# Patient Record
Sex: Female | Born: 1940
Health system: Southern US, Community
[De-identification: ages and names within clinical notes are randomized; demographics above are authoritative.]

## PROBLEM LIST (undated history)

## (undated) DIAGNOSIS — E785 Hyperlipidemia, unspecified: Secondary | ICD-10-CM

## (undated) DIAGNOSIS — K5792 Diverticulitis of intestine, part unspecified, without perforation or abscess without bleeding: Secondary | ICD-10-CM

## (undated) DIAGNOSIS — Z8619 Personal history of other infectious and parasitic diseases: Secondary | ICD-10-CM

## (undated) DIAGNOSIS — I10 Essential (primary) hypertension: Secondary | ICD-10-CM

## (undated) DIAGNOSIS — H548 Legal blindness, as defined in USA: Secondary | ICD-10-CM

## (undated) DIAGNOSIS — H3552 Pigmentary retinal dystrophy: Secondary | ICD-10-CM

## (undated) DIAGNOSIS — G709 Myoneural disorder, unspecified: Secondary | ICD-10-CM

## (undated) DIAGNOSIS — Z800 Family history of malignant neoplasm of digestive organs: Secondary | ICD-10-CM

## (undated) HISTORY — DX: Diverticulitis of intestine, part unspecified, without perforation or abscess without bleeding: K57.92

## (undated) HISTORY — DX: Family history of malignant neoplasm of digestive organs: Z80.0

## (undated) HISTORY — DX: Hyperlipidemia, unspecified: E78.5

## (undated) HISTORY — PX: EYE SURGERY: SHX253

## (undated) HISTORY — DX: Personal history of other infectious and parasitic diseases: Z86.19

## (undated) HISTORY — DX: Myoneural disorder, unspecified: G70.9

## (undated) HISTORY — DX: Legal blindness, as defined in USA: H54.8

## (undated) HISTORY — DX: Essential (primary) hypertension: I10

## (undated) HISTORY — DX: Pigmentary retinal dystrophy: H35.52

## (undated) HISTORY — PX: HEMORRHOID SURGERY: SHX153

## (undated) HISTORY — PX: SPINE SURGERY: SHX786

## (undated) HISTORY — PX: COLONOSCOPY: SHX174

## (undated) HISTORY — PX: OTHER SURGICAL HISTORY: SHX169

---

## 1998-06-08 ENCOUNTER — Other Ambulatory Visit: Admission: RE | Admit: 1998-06-08 | Discharge: 1998-06-08 | Payer: Self-pay | Admitting: Family Medicine

## 1999-07-07 ENCOUNTER — Other Ambulatory Visit: Admission: RE | Admit: 1999-07-07 | Discharge: 1999-07-07 | Payer: Self-pay | Admitting: Obstetrics and Gynecology

## 2000-01-27 ENCOUNTER — Encounter: Payer: Self-pay | Admitting: Obstetrics and Gynecology

## 2000-01-27 ENCOUNTER — Encounter: Admission: RE | Admit: 2000-01-27 | Discharge: 2000-01-27 | Payer: Self-pay | Admitting: Obstetrics and Gynecology

## 2000-10-05 ENCOUNTER — Other Ambulatory Visit: Admission: RE | Admit: 2000-10-05 | Discharge: 2000-10-05 | Payer: Self-pay | Admitting: Obstetrics and Gynecology

## 2001-03-22 ENCOUNTER — Encounter: Payer: Self-pay | Admitting: Obstetrics and Gynecology

## 2001-03-22 ENCOUNTER — Encounter: Admission: RE | Admit: 2001-03-22 | Discharge: 2001-03-22 | Payer: Self-pay | Admitting: Obstetrics and Gynecology

## 2002-02-20 ENCOUNTER — Other Ambulatory Visit: Admission: RE | Admit: 2002-02-20 | Discharge: 2002-02-20 | Payer: Self-pay | Admitting: Obstetrics and Gynecology

## 2003-02-26 ENCOUNTER — Other Ambulatory Visit: Admission: RE | Admit: 2003-02-26 | Discharge: 2003-02-26 | Payer: Self-pay | Admitting: Obstetrics and Gynecology

## 2003-03-30 ENCOUNTER — Encounter: Admission: RE | Admit: 2003-03-30 | Discharge: 2003-03-30 | Payer: Self-pay | Admitting: Obstetrics and Gynecology

## 2004-04-22 ENCOUNTER — Other Ambulatory Visit: Admission: RE | Admit: 2004-04-22 | Discharge: 2004-04-22 | Payer: Self-pay | Admitting: Obstetrics and Gynecology

## 2004-05-10 ENCOUNTER — Encounter: Admission: RE | Admit: 2004-05-10 | Discharge: 2004-05-10 | Payer: Self-pay | Admitting: Obstetrics and Gynecology

## 2004-09-16 ENCOUNTER — Ambulatory Visit: Payer: Self-pay | Admitting: Gastroenterology

## 2004-09-29 ENCOUNTER — Ambulatory Visit (HOSPITAL_COMMUNITY): Admission: RE | Admit: 2004-09-29 | Discharge: 2004-09-29 | Payer: Self-pay | Admitting: Gastroenterology

## 2004-09-29 ENCOUNTER — Encounter (INDEPENDENT_AMBULATORY_CARE_PROVIDER_SITE_OTHER): Payer: Self-pay | Admitting: *Deleted

## 2004-09-29 ENCOUNTER — Ambulatory Visit: Payer: Self-pay | Admitting: Gastroenterology

## 2005-04-27 ENCOUNTER — Other Ambulatory Visit: Admission: RE | Admit: 2005-04-27 | Discharge: 2005-04-27 | Payer: Self-pay | Admitting: Obstetrics and Gynecology

## 2006-08-13 ENCOUNTER — Emergency Department (HOSPITAL_COMMUNITY): Admission: EM | Admit: 2006-08-13 | Discharge: 2006-08-14 | Payer: Self-pay | Admitting: Emergency Medicine

## 2006-09-03 ENCOUNTER — Ambulatory Visit: Payer: Self-pay

## 2006-09-03 ENCOUNTER — Encounter: Payer: Self-pay | Admitting: Internal Medicine

## 2008-06-27 ENCOUNTER — Encounter: Admission: RE | Admit: 2008-06-27 | Discharge: 2008-06-27 | Payer: Self-pay | Admitting: Internal Medicine

## 2008-07-07 ENCOUNTER — Telehealth: Payer: Self-pay | Admitting: Internal Medicine

## 2008-07-10 DIAGNOSIS — IMO0002 Reserved for concepts with insufficient information to code with codable children: Secondary | ICD-10-CM | POA: Insufficient documentation

## 2008-07-13 ENCOUNTER — Ambulatory Visit: Payer: Self-pay | Admitting: Internal Medicine

## 2008-07-13 DIAGNOSIS — K5732 Diverticulitis of large intestine without perforation or abscess without bleeding: Secondary | ICD-10-CM

## 2008-07-13 DIAGNOSIS — R1032 Left lower quadrant pain: Secondary | ICD-10-CM

## 2008-07-13 LAB — CONVERTED CEMR LAB
Ketones, ur: NEGATIVE mg/dL
Specific Gravity, Urine: <=1.005 (ref 1.000–1.030)
Urobilinogen, UA: 0.2 (ref 0.0–1.0)

## 2008-07-17 ENCOUNTER — Ambulatory Visit: Payer: Self-pay | Admitting: Cardiovascular Disease

## 2008-07-17 ENCOUNTER — Ambulatory Visit: Payer: Self-pay | Admitting: Gastroenterology

## 2008-07-17 ENCOUNTER — Telehealth: Payer: Self-pay | Admitting: Internal Medicine

## 2009-01-25 ENCOUNTER — Telehealth: Payer: Self-pay | Admitting: Internal Medicine

## 2009-10-12 ENCOUNTER — Encounter: Payer: Self-pay | Admitting: Internal Medicine

## 2010-02-09 ENCOUNTER — Encounter (INDEPENDENT_AMBULATORY_CARE_PROVIDER_SITE_OTHER): Payer: Self-pay | Admitting: *Deleted

## 2010-03-11 ENCOUNTER — Encounter (INDEPENDENT_AMBULATORY_CARE_PROVIDER_SITE_OTHER): Payer: Self-pay | Admitting: *Deleted

## 2010-03-17 ENCOUNTER — Telehealth: Payer: Self-pay | Admitting: Internal Medicine

## 2010-03-17 ENCOUNTER — Ambulatory Visit
Admission: RE | Admit: 2010-03-17 | Discharge: 2010-03-17 | Payer: Self-pay | Source: Home / Self Care | Attending: Internal Medicine | Admitting: Internal Medicine

## 2010-04-12 NOTE — Letter (Signed)
Summary: Pre Visit Letter Revised  [facility]  [address] [place], [place] [address]   Phone: [phone]  Fax: [phone]        02/09/2010 MRN: [id number] [facility] [place] [address] [place], [place]  [address]             Procedure Date:  03/31/2010   Welcome to the Gastroenterology Division at [facility].    You are scheduled to see a nurse for your pre-procedure visit on 03/17/2010 at 8:30 AM on the 3rd floor at [facility], [address]  We ask that you try to arrive at our office 15 minutes prior to your appointment time to allow for check-in.  Please take a minute to review the attached form.  If you answer "Yes" to one or more of the questions on the first page, we ask that you call the person listed at your earliest opportunity.  If you answer "No" to all of the questions, please complete the rest of the form and bring it to your appointment.    Your nurse visit will consist of discussing your medical and surgical history, your immediate family medical history, and your medications.   If you are unable to list all of your medications on the form, please bring the medication bottles to your appointment and we will list them.  We will need to be aware of both prescribed and over the counter drugs.  We will need to know exact dosage information as well.    Please be prepared to read and sign documents such as consent forms, a financial agreement, and acknowledgement forms.  If necessary, and with your consent, a friend or relative is welcome to sit-in on the nurse visit with you.  Please bring your insurance card so that we may make a copy of it.  If your insurance requires a referral to see a specialist, please bring your referral form from your primary care physician.  No co-pay is required for this nurse visit.     If you cannot keep your appointment, please call [phone] to cancel or reschedule prior to your appointment date.  This allows  [place] the opportunity to schedule an appointment for another patient in need of care.    Thank you for choosing [facility] for your medical needs.  We appreciate the opportunity to care for you.  Please visit [place] at our website  to learn more about our practice.  Sincerely, The Gastroenterology Division

## 2010-04-12 NOTE — Letter (Signed)
Summary: Colonoscopy Letter  [facility]  [address] [place], [place] [address]   Phone: [phone]  Fax: [phone]      October 12, 2009 MRN: [id number]   [facility] [place] [address] [place], [place]  [address]   Dear Ms. Fernicola,   According to your medical record, it is time for you to schedule a Colonoscopy. The American Cancer Society recommends this procedure as a method to detect early colon cancer. Patients with a family history of colon cancer, or a personal history of colon polyps or inflammatory bowel disease are at increased risk.  This letter has been generated based on the recommendations made at the time of your procedure. If you feel that in your particular situation this may no longer apply, please contact our office.  Please call our office at [phone] to schedule this appointment or to update your records at your earliest convenience.  Thank you for cooperating with [place] to provide you with the very best care possible.   Sincerely,  Wilhemina Bonito. Marina Goodell, M.D.  [facility] Division [phone]

## 2010-04-12 NOTE — Progress Notes (Signed)
Summary: inflamed colon  Phone Note Call from Patient Call back at Work Phone [phone]   Caller: Patient Call For: Dr. Marina Goodell Reason for Call: Talk to Nurse Details for Reason: inflamed colon Summary of Call: previous pt of Dr. Hilda Blades... requesting to be est with Dr. Marina Goodell... pt would like a "scan" of her colon- not a COL... pt says her colon is inflamed and that she has diverticulitis... pt would not sch a consultation with me... wants to discuss further what type pf appt she needs to sch Initial call taken by: Vallarie Mare,  July 07, 2008 12:39 PM  Follow-up for Phone Call        Patient given appointment with Dr Marina Goodell on 07/13/08 at 4pm. She was a patient of Dr Luellen Pucker and needs follow up.  Primary care doctor will fax over updated notes and labs test patient has had.  Will bring meds insurance and copay. Follow-up by: Paulene Floor, RN,  July 07, 2008 1:42 PM

## 2010-04-14 NOTE — Letter (Signed)
Summary: [facility] Instructions  [facility]  [address] [place], [place] [address]   Phone: [phone]  Fax: [phone]       Crystal Stokes    Jul 22, 1940    MRN: [id number]        Procedure Day /Date: Thursday 03/31/2010     Arrival Time: 7:30AM     Procedure Time: 8:30AM     Location of Procedure:                    _X_  [facility] (4th Floor)                       PREPARATION FOR COLONOSCOPY WITH MOVIPREP   Starting 5 days prior to your procedure 03/26/2010 do not eat nuts, seeds, popcorn, corn, beans, peas,  salads, or any raw vegetables.  Do not take any fiber supplements (e.g. Metamucil, Citrucel, and Benefiber).  THE DAY BEFORE YOUR PROCEDURE         DATE: 1/18     DAY: Wednesday  1.  Drink clear liquids the entire day-NO SOLID FOOD  2.  Do not drink anything colored red or purple.  Avoid juices with pulp.  No orange juice.  3.  Drink at least 64 oz. (8 glasses) of fluid/clear liquids during the day to prevent dehydration and help the prep work efficiently.  CLEAR LIQUIDS INCLUDE: Water Jello Ice Popsicles Tea (sugar ok, no milk/cream) Powdered fruit flavored drinks Coffee (sugar ok, no milk/cream) Gatorade Juice: apple, white grape, white cranberry  Lemonade Clear bullion, consomm, broth Carbonated beverages (any kind) Strained chicken noodle soup Hard Candy                             4.  In the morning, mix first dose of MoviPrep solution:    Empty 1 Pouch A and 1 Pouch B into the disposable container    Add lukewarm drinking water to the top line of the container. Mix to dissolve    Refrigerate (mixed solution should be used within 24 hrs)  5.  Begin drinking the prep at 5:00 p.m. The MoviPrep container is divided by 4 marks.   Every 15 minutes drink the solution down to the next mark (approximately 8 oz) until the full liter is complete.   6.  Follow completed prep with 16 oz of clear liquid of your choice  (Nothing red or purple).  Continue to drink clear liquids until bedtime.  7.  Before going to bed, mix second dose of MoviPrep solution:    Empty 1 Pouch A and 1 Pouch B into the disposable container    Add lukewarm drinking water to the top line of the container. Mix to dissolve    Refrigerate  THE DAY OF YOUR PROCEDURE      DATE:  1/19   DAY: Thursday  Beginning at 3:30AM (5 hours before procedure):         1. Every 15 minutes, drink the solution down to the next mark (approx 8 oz) until the full liter is complete.  2. Follow completed prep with 16 oz. of clear liquid of your choice.    3. You may drink clear liquids until 6:30AM (2 HOURS BEFORE PROCEDURE).   MEDICATION INSTRUCTIONS  Unless otherwise instructed, you should take regular prescription medications with a small sip of water   as early as possible the morning of  your procedure.         OTHER INSTRUCTIONS  You will need a responsible adult at least 69 years of age to accompany you and drive you home.   This person must remain in the waiting room during your procedure.  Wear loose fitting clothing that is easily removed.  Leave jewelry and other valuables at home.  However, you may wish to bring a book to read or  an iPod/MP3 player to listen to music as you wait for your procedure to start.  Remove all body piercing jewelry and leave at home.  Total time from sign-in until discharge is approximately 2-3 hours.  You should go home directly after your procedure and rest.  You can resume normal activities the  day after your procedure.  The day of your procedure you should not:   Drive   Make legal decisions   Operate machinery   Drink alcohol   Return to work  You will receive specific instructions about eating, activities and medications before you leave.    The above instructions have been reviewed and explained to me by   Karl Bales RN  March 17, 2010 8:50 AM    I fully  understand and can verbalize these instructions _____________________________ Date _________

## 2010-04-14 NOTE — Miscellaneous (Signed)
Summary: [facility] previsit  Clinical Lists Changes  Medications: Added new medication of MOVIPREP 100 GM  SOLR (PEG-KCL-NACL-NASULF-NA ASC-C) As per prep instructions. - Signed Rx of MOVIPREP 100 GM  SOLR (PEG-KCL-NACL-NASULF-NA ASC-C) As per prep instructions.;  #1 x 0;  Signed;  Entered by: Karl Bales RN;  Authorized by: Hilarie Fredrickson MD;  Method used: Electronically to [facility] # [phone]*, [address] [place], [place], [place]  [address], Ph: [phone], Fax: [phone] Observations: Added new observation of NKA: T (03/17/2010 8:14)    Prescriptions: MOVIPREP 100 GM  SOLR (PEG-KCL-NACL-NASULF-NA ASC-C) As per prep instructions.  #1 x 0   Entered by:   Karl Bales RN   Authorized by:   Hilarie Fredrickson MD   Signed by:   Karl Bales RN on 03/17/2010   Method used:   Electronically to        [facility] # [phone]* (retail)       [address] [place], [place]  [address]       Ph: [phone]       Fax: [phone]   RxID:   [id number]

## 2010-04-14 NOTE — Progress Notes (Signed)
Summary: Colonoscopy / sedation  Phone Note Outgoing Call   Call placed by: Karl Bales RN,  March 17, 2010 9:40 AM Summary of Call: Dr. Marina Goodell, This pt. was seen this morning for her previsit. Her colonoscopy is scheduled for 03-31-10.  While reviewing her last procedure, I noted she had an incomplete exam due to a redundant and severly torturous colon in 2006 with Dr. Doreatha Martin.  She received Fentanyl 125 micrograms and Versed 12 mg.  I discussed with her if  her pain was adequately controlled with her last procedure and she stated no, but she had never had problems before.  I discussed with her the option of using Propofol and she refused.  I just wanted you to be aware of her situation.  Thank you.  Karl Bales Initial call taken by: Karl Bales RN,  March 17, 2010 9:43 AM  Follow-up for Phone Call        ok, but propofol would be great for her. Tell her that we talked, and I highly recommend this for her Follow-up by: Hilarie Fredrickson MD,  March 17, 2010 10:18 AM  Additional Follow-up for Phone Call Additional follow up Details #1::        Called pt and left voicemail for her to call back Additional Follow-up by: Karl Bales RN,  March 17, 2010 11:39 AM    Additional Follow-up for Phone Call Additional follow up Details #2::    Spoke with pt. again and above from Dr. Marina Goodell explained.  Emphasized the prep is the same, her VS are monitored in the same way as if Fentanyl and Versed were given and recovery room time is the same.  She states that she wanted to talk to her husband before making a decision and will call back in the a.m. Follow-up by: Karl Bales RN,  March 17, 2010 2:06 PM  Additional Follow-up for Phone Call Additional follow up Details #3:: Details for Additional Follow-up Action Taken: ok. thanks. Hilarie Fredrickson MD  March 17, 2010 2:14 PM   Spoke with pt and she is agreeable to having procedure changed to January 31st.  Procedure for 03-31-10  cancelled and rescheduled for 04-12-10 at 9:30 a.m.  Pt notified Additional Follow-up by: Karl Bales RN,  March 17, 2010 2:26 PM

## 2010-04-28 ENCOUNTER — Other Ambulatory Visit: Payer: Self-pay | Admitting: Internal Medicine

## 2010-06-03 ENCOUNTER — Encounter (HOSPITAL_BASED_OUTPATIENT_CLINIC_OR_DEPARTMENT_OTHER)
Admission: RE | Admit: 2010-06-03 | Discharge: 2010-06-03 | Disposition: A | Discharge status: Home / Self Care | Payer: BC Managed Care – PPO | Source: Ambulatory Visit | Attending: Surgery | Admitting: Surgery

## 2010-06-03 LAB — BASIC METABOLIC PANEL
CO2: 28 mEq/L (ref 19–32)
Chloride: 106 mEq/L (ref 96–112)
GFR calc Af Amer: >60 mL/min (ref >60)
GFR calc non Af Amer: >60 mL/min (ref >60)
Glucose, Bld: 132 mg/dL — ABNORMAL HIGH (ref 70–99)
Potassium: 3.8 mEq/L (ref 3.5–5.1)

## 2010-06-08 ENCOUNTER — Other Ambulatory Visit: Payer: Self-pay | Admitting: Surgery

## 2010-06-08 ENCOUNTER — Ambulatory Visit (HOSPITAL_BASED_OUTPATIENT_CLINIC_OR_DEPARTMENT_OTHER)
Admission: RE | Admit: 2010-06-08 | Discharge: 2010-06-08 | Disposition: A | Discharge status: Home / Self Care | Payer: BC Managed Care – PPO | Source: Ambulatory Visit | Attending: Surgery | Admitting: Surgery

## 2010-06-08 DIAGNOSIS — Z01812 Encounter for preprocedural laboratory examination: Secondary | ICD-10-CM | POA: Insufficient documentation

## 2010-06-08 DIAGNOSIS — K644 Residual hemorrhoidal skin tags: Secondary | ICD-10-CM | POA: Insufficient documentation

## 2010-06-08 DIAGNOSIS — L290 Pruritus ani: Secondary | ICD-10-CM | POA: Insufficient documentation

## 2010-06-08 LAB — POCT HEMOGLOBIN-HEMACUE: Hemoglobin: 12.3 g/dL (ref 12.0–15.0)

## 2010-06-27 NOTE — Op Note (Signed)
  NAME:  Crystal Stokes, WOEHL A             ACCOUNT NO.:  [id number]  MEDICAL RECORD NO.:  [id number]          PATIENT TYPE:  AMB  LOCATION:                               FACILITY:  [facility]  PHYSICIAN:  Thornton Park. Daphine Deutscher, MD  DATE OF BIRTH:  December 17, 1940  DATE OF PROCEDURE:  06/08/2010 DATE OF DISCHARGE:                              OPERATIVE REPORT   PREOPERATIVE DIAGNOSIS:  Postdefecation pruritus with no external hemorrhoids at 12 o'clock in the dorsal lithotomy position.  PROCEDURE:  Exam under anesthesia, biopsy of ulcerated area at the 6 o'clock position up near the dentate line, no banding, anterior external hemorrhoidectomy.  ANESTHESIA:  Local 10 mL with 0.5% Marcaine with LMA.  SURGEON:  Thornton Park. Daphine Deutscher, MD  DESCRIPTION OF PROCEDURE:  [name] was taken to room 8 to [facility] on Wednesday, June 08, 2010.  General was provided with an LMA. She was placed in dorsal lithotomy position.  Her perineum was prepped with PC max and draped sterilely.  Exam under anesthesia was performed first digitally, I could feel a little something posteriorly where there might have been thrombosed hemorrhoid.  I surveyed the abdomen with using the bullet retractor and did not really see much in the way of internal hemorrhoids that I would necessary band, but at the 12 o'clock position initially I thought it might be a little solitary rectal ulcer, but then again I did not think it was right at the dentate line.  It was very subtle and so I went ahead and took a little mucosal biopsy of this area.  This was done with a free blade and it was sent separately for at the 6 o'clock position up in the inside.  Cautery was used to initially control.  I had some lateral spread along the wall at 5 o'clock in the internal hemorrhoidal column from the heat.  I then controlled this with a single suture of 3-0 chromic inside.  Next, the anterior external hemorrhoid hood was excised cutting around that  leaving this sort of a crescent shaped skin defect.  This was closed from right to left with a running simple suture of 3-0 chromic after cauterizing it first.  I then injected the entire region with 10 mL of 0.5% Marcaine and massaged with some Betadine ointment into the entire area and sterile dressings were applied.  The patient will be given some Roxicet elixir to take if she needs it for pain.  I gave her and her husband instructions preoperatively on management.  We will see her back in the office in about 3 weeks.     Thornton Park Daphine Deutscher, MD     MBM/MEDQ  D:  06/08/2010  T:  06/09/2010  Job:  [id number]  Electronically Signed by Luretha Murphy MD on 06/27/2010 09:27:02 AM

## 2010-07-01 ENCOUNTER — Other Ambulatory Visit: Payer: Self-pay | Admitting: Internal Medicine

## 2010-07-13 ENCOUNTER — Other Ambulatory Visit: Payer: Self-pay | Admitting: Family Medicine

## 2010-07-13 ENCOUNTER — Other Ambulatory Visit: Payer: Self-pay | Admitting: Internal Medicine

## 2010-07-13 DIAGNOSIS — R1011 Right upper quadrant pain: Secondary | ICD-10-CM

## 2010-07-20 ENCOUNTER — Ambulatory Visit
Admission: RE | Admit: 2010-07-20 | Discharge: 2010-07-20 | Disposition: A | Discharge status: Home / Self Care | Payer: BC Managed Care – PPO | Source: Ambulatory Visit | Attending: Family Medicine | Admitting: Family Medicine

## 2010-07-20 DIAGNOSIS — R1011 Right upper quadrant pain: Secondary | ICD-10-CM

## 2010-07-20 MED ORDER — IOHEXOL 300 MG/ML  SOLN
100.0000 mL | Freq: Once | INTRAMUSCULAR | Status: AC | PRN
  Administered 2010-07-20: 100 mL via INTRAVENOUS

## 2010-07-22 ENCOUNTER — Other Ambulatory Visit: Payer: Self-pay | Admitting: Internal Medicine

## 2010-08-18 ENCOUNTER — Other Ambulatory Visit: Payer: Self-pay | Admitting: Internal Medicine

## 2010-09-06 ENCOUNTER — Encounter: Payer: Self-pay | Admitting: Internal Medicine

## 2010-09-06 ENCOUNTER — Other Ambulatory Visit: Payer: BC Managed Care – PPO

## 2010-09-06 ENCOUNTER — Telehealth: Payer: Self-pay | Admitting: Internal Medicine

## 2010-09-06 ENCOUNTER — Ambulatory Visit (INDEPENDENT_AMBULATORY_CARE_PROVIDER_SITE_OTHER): Payer: BC Managed Care – PPO | Admitting: Internal Medicine

## 2010-09-06 VITALS — BP 128/62 | HR 74 | Ht 66.0 in | Wt 148.0 lb

## 2010-09-06 DIAGNOSIS — Z9283 Personal history of failed moderate sedation: Secondary | ICD-10-CM

## 2010-09-06 DIAGNOSIS — R143 Flatulence: Secondary | ICD-10-CM

## 2010-09-06 DIAGNOSIS — R142 Eructation: Secondary | ICD-10-CM

## 2010-09-06 DIAGNOSIS — R141 Gas pain: Secondary | ICD-10-CM

## 2010-09-06 DIAGNOSIS — Z800 Family history of malignant neoplasm of digestive organs: Secondary | ICD-10-CM

## 2010-09-06 MED ORDER — PEG-KCL-NACL-NASULF-NA ASC-C 100 G PO SOLR: 1.0000 | Freq: Once | ORAL | Status: DC

## 2010-09-06 MED ORDER — METRONIDAZOLE 250 MG PO TABS: 250.0000 mg | ORAL_TABLET | Freq: Every day | ORAL | Status: AC

## 2010-09-06 NOTE — Patient Instructions (Addendum)
Colon with Propoful [facility] 09/27/10 11:00 am arrive at 10:00 am on 4th floor Moviprep sent to your pharmacy. Colon brochure given for you to review. Please go to basement floor for your labs. Take the Align x 2 weeks after completing your Flagyl.

## 2010-09-06 NOTE — Telephone Encounter (Signed)
Spoke with pt, she had questions regarding her medication she was given today. Reviewed instructions with pt and questions answered.

## 2010-09-06 NOTE — Progress Notes (Signed)
HISTORY OF PRESENT ILLNESS:  Crystal Stokes is a 70 y.o. female with the below listed medical history. She is followed in the esophagus principally for screening colonoscopy due to to a family history of colon cancer. She was last seen on 07/13/2008 regarding left lower quadrant discomfort of uncertain cause. Negative CT scan and negative urinalysis. She was to have set up repeat screening colonoscopy last year, but has yet to do so. Earlier this year she underwent surgical evaluation for hemorrhoidectomy and biopsy of ulcerated region. The biopsy revealed changes consistent with mucosal prolapse. Her chief complaint today is that of chronic bloating. This has been present for at least a year but worse in the past 3-6 months. Symptoms are less prominent in the morning and more prominent in the afternoon and evening. Symptoms are exacerbated by meals. Symptoms improve post defecation. Bowel habits are described as normal. No significant weight change. No belching but increased flatus. She has tried to alter her diet, avoid gluten, administer Zantac, and Beano. Unfortunately, these manipulations did not prove hopeful. She exercises regularly. No new medications. Because of the uncomfortable nature of the bloating, her primary provider scheduled a CT scan of the abdomen and pelvis with contrast. This was performed on 07/20/2010. The examination was unremarkable. Her last colonoscopy was performed in July 2006. Examination was incomplete but normal. Followup barium enema to clear the right colon was unremarkable. GI review of systems is otherwise negative  REVIEW OF SYSTEMS:  All non-GI ROS negative.  Past Medical History  Diagnosis Date  . Family history of colon cancer   . Hypertension   . Diverticulitis   . Legal blindness   . Hemorrhoids   . History of shingles     Past Surgical History  Procedure Date  . Hemorrhoid surgery     Social History Crystal Stokes  reports that she has never  smoked. She has never used smokeless tobacco. She reports that she drinks alcohol. She reports that she does not use illicit drugs.  family history includes Colon cancer in her mother.  No Known Allergies     PHYSICAL EXAMINATION: Vital signs: BP 128/62  Pulse 74  Ht 5\' 6"  (1.676 m)  Wt 148 lb (67.132 kg)  BMI 23.89 kg/m2 General: Well-developed, well-nourished, no acute distress HEENT: Sclerae are anicteric, conjunctiva pink. Oral mucosa intact Lungs: Clear Heart: Regular Abdomen: soft, nontender, nondistended, no obvious ascites, no peritoneal signs, normal bowel sounds. No organomegaly. Extremities: No edema Psychiatric: alert and oriented x3. Cooperative    ASSESSMENT:  #1. Chronic bloating. No alarm features. Negative CT. #2. Family history of colon cancer. Last colonoscopy in 2006 in complete. Negative subsequent barium enema. Due for followup   PLAN:  #1. Screen for celiac disease with serologies #2. Discussion on increased intestinal gas #3. Brochure on gas provided #4. Anti-gas and flatulence dietary she provided #5. Empiric trial of metronidazole 250 mg 4 times a day x7 days. Advised about potential side effects and to avoid alcohol #6. Probiotic Align one daily for 2 weeks to be initiated after antibiotic therapy. 2 weeks of samples provided #7. Schedule surveillance colonoscopy. Given prior difficulties, recommend CRNA monitored propofol sedation. Movi prep prescribed. Patient instructed on its use.The nature of the procedure, as well as the risks, benefits, and alternatives were carefully and thoroughly reviewed with the patient. Ample time for discussion and questions allowed. The patient understood, was satisfied, and agreed to proceed.

## 2010-09-08 ENCOUNTER — Telehealth: Payer: Self-pay

## 2010-09-08 NOTE — Telephone Encounter (Signed)
Message copied by Michele Mcalpine on Thu Sep 08, 2010 10:18 AM ------      Message from: Hilarie Fredrickson      Created: Thu Sep 08, 2010  9:03 AM       Please let the patient noted that her celiac testing returned normal

## 2010-09-08 NOTE — Telephone Encounter (Signed)
Left pt a message regarding results per Dr. Marina Goodell.

## 2010-09-27 ENCOUNTER — Encounter: Payer: Self-pay | Admitting: Internal Medicine

## 2010-09-27 ENCOUNTER — Ambulatory Visit (AMBULATORY_SURGERY_CENTER): Payer: BC Managed Care – PPO | Admitting: Internal Medicine

## 2010-09-27 VITALS — BP 138/64 | HR 74 | Temp 98.4°F | Resp 18 | Ht 65.0 in | Wt 140.0 lb

## 2010-09-27 DIAGNOSIS — R142 Eructation: Secondary | ICD-10-CM

## 2010-09-27 DIAGNOSIS — D1330 Benign neoplasm of unspecified part of small intestine: Secondary | ICD-10-CM

## 2010-09-27 DIAGNOSIS — D126 Benign neoplasm of colon, unspecified: Secondary | ICD-10-CM

## 2010-09-27 DIAGNOSIS — Z1211 Encounter for screening for malignant neoplasm of colon: Secondary | ICD-10-CM

## 2010-09-27 DIAGNOSIS — Z800 Family history of malignant neoplasm of digestive organs: Secondary | ICD-10-CM

## 2010-09-27 DIAGNOSIS — K5730 Diverticulosis of large intestine without perforation or abscess without bleeding: Secondary | ICD-10-CM

## 2010-09-27 MED ORDER — SODIUM CHLORIDE 0.9 % IV SOLN: 500.0000 mL | INTRAVENOUS | Status: DC

## 2010-09-27 NOTE — Patient Instructions (Signed)
Discharge instructions given with verbal understanding. Handouts on polyps, diverticulosis, and hemorrhoids given. Resume previous medications.

## 2010-09-28 ENCOUNTER — Telehealth: Payer: Self-pay | Admitting: *Deleted

## 2010-09-28 NOTE — Telephone Encounter (Signed)
No answer

## 2010-12-29 LAB — BASIC METABOLIC PANEL
Calcium: 9.4
GFR calc Af Amer: >60
GFR calc non Af Amer: >60
Glucose, Bld: 129 — ABNORMAL HIGH
Sodium: 139

## 2010-12-29 LAB — CBC
Hemoglobin: 12.4
RDW: 12.6
WBC: 6.6

## 2010-12-29 LAB — DIFFERENTIAL
Basophils Absolute: 0
Lymphocytes Relative: 37
Lymphs Abs: 2.4
Monocytes Absolute: 0.6
Neutro Abs: 3.4

## 2010-12-29 LAB — SEDIMENTATION RATE: Sed Rate: 12

## 2011-02-10 ENCOUNTER — Other Ambulatory Visit: Payer: Self-pay | Admitting: Obstetrics and Gynecology

## 2011-04-27 ENCOUNTER — Ambulatory Visit: Payer: BC Managed Care – PPO

## 2011-04-27 ENCOUNTER — Ambulatory Visit (INDEPENDENT_AMBULATORY_CARE_PROVIDER_SITE_OTHER): Payer: BC Managed Care – PPO | Admitting: Internal Medicine

## 2011-04-27 VITALS — BP 110/62 | HR 76 | Temp 98.0°F | Resp 18 | Ht 64.0 in | Wt 148.8 lb

## 2011-04-27 DIAGNOSIS — M25512 Pain in left shoulder: Secondary | ICD-10-CM

## 2011-04-27 DIAGNOSIS — M542 Cervicalgia: Secondary | ICD-10-CM

## 2011-04-27 DIAGNOSIS — M5414 Radiculopathy, thoracic region: Secondary | ICD-10-CM

## 2011-04-27 DIAGNOSIS — M25519 Pain in unspecified shoulder: Secondary | ICD-10-CM

## 2011-04-27 DIAGNOSIS — IMO0002 Reserved for concepts with insufficient information to code with codable children: Secondary | ICD-10-CM

## 2011-04-27 LAB — POCT CBC
Granulocyte percent: 54.8 %G (ref 37–80)
HCT, POC: 39.2 % (ref 37.7–47.9)
MPV: 8.2 fL (ref 0–99.8)
POC Granulocyte: 3.2 (ref 2–6.9)
POC LYMPH PERCENT: 37.3 %L (ref 10–50)
RDW, POC: 14.1 %

## 2011-04-27 LAB — POCT SEDIMENTATION RATE: POCT SED RATE: 27 mm/hr — AB (ref 0–22)

## 2011-04-27 MED ORDER — LIDOCAINE 5 % EX PTCH: 1.0000 | MEDICATED_PATCH | CUTANEOUS | Status: DC

## 2011-04-27 MED ORDER — LIDOCAINE 5 % EX PTCH: 1.0000 | MEDICATED_PATCH | CUTANEOUS | Status: AC

## 2011-04-27 MED ORDER — GABAPENTIN 300 MG PO CAPS: 300.0000 mg | ORAL_CAPSULE | Freq: Three times a day (TID) | ORAL | Status: DC

## 2011-04-27 NOTE — Progress Notes (Addendum)
     Patient ID: Crystal Stokes, female   DOB: Oct 16, 1940, 71 y.o.   MRN: [id number]  HPI C/o chronic pain over 2 yrs at left lower neck and upper left t-spine.Pain is constant, waxes and wanes,can wake her at hs. No radiation,weakness,tingling,or function loss.  This year she has had a cpe with gyn Dr. Tenny Craw, colonoscopy with Dr. Marina Goodell, normal mamogram.  Review of Systems Neg.    Objective:   Physical Exam  Nursing note and vitals reviewed. Constitutional: She is oriented to person, place, and time. She appears well-developed and well-nourished. No distress.  HENT:  Head: Normocephalic.  Eyes: Conjunctivae, EOM and lids are normal.       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  Neck: Muscular tenderness present. Rigidity present.    Musculoskeletal:       Right shoulder: She exhibits decreased range of motion. She exhibits no tenderness, no pain and normal strength.       Arms:      Lower neck and upper thorac. paraspinous tenderness  Neurological: She is alert and oriented to person, place, and time. She has normal strength and normal reflexes. No sensory deficit. Coordination normal.  Reflex Scores:      Tricep reflexes are 2+ on the right side and 2+ on the left side.      Bicep reflexes are 2+ on the right side and 2+ on the left side.  [facility] reading (PRIMARY) by  Dr. Perrin Maltese severe ddd and spondylosis c-spine.      Assessment:     C- T spine pain and paraspinous pain.   Plan:      XR t and c spine    Trial of Lidoderm patches for comfort Neurontin 300mg  prn Vicodin 5/325 prn OK to refill one year

## 2011-04-28 ENCOUNTER — Encounter: Payer: Self-pay | Admitting: *Deleted

## 2011-04-28 ENCOUNTER — Other Ambulatory Visit: Payer: Self-pay | Admitting: Physician Assistant

## 2011-04-28 LAB — COMPREHENSIVE METABOLIC PANEL
ALT: 11 U/L (ref 0–35)
AST: 17 U/L (ref 0–37)
Alkaline Phosphatase: 88 U/L (ref 39–117)
CO2: 24 mEq/L (ref 19–32)
Sodium: 140 mEq/L (ref 135–145)
Total Bilirubin: 0.6 mg/dL (ref 0.3–1.2)
Total Protein: 7.2 g/dL (ref 6.0–8.3)

## 2011-04-28 LAB — TSH: TSH: 2.684 u[IU]/mL (ref 0.350–4.500)

## 2011-04-28 MED ORDER — LIDOCAINE 5 % EX PTCH: 1.0000 | MEDICATED_PATCH | CUTANEOUS | Status: AC

## 2011-05-04 ENCOUNTER — Telehealth: Payer: Self-pay

## 2011-05-04 NOTE — Telephone Encounter (Signed)
Called husband's office and spoke with him Misty Stanley not on HIPPA) to get info about what medication is needed. Husband didn't know anything about it and asked we call wife directly. LMOM at pts H and C #s to CB

## 2011-05-04 NOTE — Telephone Encounter (Signed)
Lisa from pts husband's office called to check status of pts medication.

## 2011-05-05 NOTE — Telephone Encounter (Signed)
Pt CB and I explained to her that a prior auth was needed on her lidocaine patch. I have filled out form and faxed it back to ins and we are waiting for an answer as to whether it will be approved. Pt should be notified as well as [place], and pt can also try to have pharmacy run Rx back through in 1-2 days and see if it is covered. Pt agreed.

## 2011-07-27 ENCOUNTER — Telehealth: Payer: Self-pay

## 2011-07-27 ENCOUNTER — Other Ambulatory Visit: Payer: Self-pay | Admitting: Internal Medicine

## 2011-07-27 DIAGNOSIS — M542 Cervicalgia: Secondary | ICD-10-CM

## 2011-08-09 ENCOUNTER — Other Ambulatory Visit: Payer: Self-pay | Admitting: Internal Medicine

## 2011-08-09 DIAGNOSIS — M542 Cervicalgia: Secondary | ICD-10-CM

## 2011-08-20 ENCOUNTER — Ambulatory Visit
Admission: RE | Admit: 2011-08-20 | Discharge: 2011-08-20 | Disposition: A | Discharge status: Home / Self Care | Payer: BC Managed Care – PPO | Source: Ambulatory Visit | Attending: Internal Medicine | Admitting: Internal Medicine

## 2011-08-20 DIAGNOSIS — M542 Cervicalgia: Secondary | ICD-10-CM

## 2011-08-21 ENCOUNTER — Telehealth: Payer: Self-pay

## 2011-08-21 NOTE — Telephone Encounter (Signed)
Patient would like to speak with dr guest regarding her mri and neurosurgeon

## 2011-08-22 NOTE — Telephone Encounter (Signed)
Discussed Dr Ernestene Mention note on MRI results w/ pt and explained where the MRI showed there were problems. Pt asked Dr Perrin Maltese if he would refer/recommend her to a neurosurgeon. She has heard favorable reports on Dr Dutch Quint and Dr Gerlene Fee who work at the same practice, but would want Dr Ernestene Mention approval of them, or would go to whoever he thinks is best for her situation. Dr Perrin Maltese, pt wanted to thank you for all that you have done to help her.

## 2011-08-24 ENCOUNTER — Other Ambulatory Visit: Payer: Self-pay | Admitting: Internal Medicine

## 2011-08-24 DIAGNOSIS — M5090 Cervical disc disorder, unspecified, unspecified cervical region: Secondary | ICD-10-CM

## 2011-08-28 ENCOUNTER — Telehealth: Payer: Self-pay

## 2011-08-28 NOTE — Telephone Encounter (Signed)
X-ray disc copied by X-ray and in pick up drawer ready for pick up.

## 2011-08-28 NOTE — Telephone Encounter (Signed)
PT WOULD LIKE TO COME BY AND P/U A COPY OF HER XRAYS. WOULD LIKE TO COME BY TOMORRW AND GET PLEASE CALL PT AT [phone]

## 2011-09-27 ENCOUNTER — Other Ambulatory Visit: Payer: Self-pay | Admitting: Neurosurgery

## 2011-09-27 DIAGNOSIS — M542 Cervicalgia: Secondary | ICD-10-CM

## 2011-10-02 ENCOUNTER — Ambulatory Visit
Admission: RE | Admit: 2011-10-02 | Discharge: 2011-10-02 | Disposition: A | Discharge status: Home / Self Care | Payer: BC Managed Care – PPO | Source: Ambulatory Visit | Attending: Neurosurgery | Admitting: Neurosurgery

## 2011-10-02 VITALS — BP 117/82 | HR 83

## 2011-10-02 DIAGNOSIS — M542 Cervicalgia: Secondary | ICD-10-CM

## 2011-10-02 MED ORDER — IOHEXOL 300 MG/ML  SOLN
10.0000 mL | Freq: Once | INTRAMUSCULAR | Status: AC | PRN
  Administered 2011-10-02: 10 mL via EPIDURAL

## 2011-10-02 NOTE — Progress Notes (Addendum)
Pt prefers not to take valium.  11:32 pt is awaiting CT and is resting quietly.

## 2011-10-07 ENCOUNTER — Other Ambulatory Visit: Payer: Self-pay | Admitting: *Deleted

## 2011-10-07 MED ORDER — HYDROCODONE-ACETAMINOPHEN 5-325 MG PO TABS: ORAL_TABLET | ORAL | Status: DC

## 2011-10-11 ENCOUNTER — Other Ambulatory Visit: Payer: Self-pay | Admitting: Neurosurgery

## 2011-10-11 ENCOUNTER — Encounter (HOSPITAL_COMMUNITY): Payer: Self-pay | Admitting: *Deleted

## 2011-10-11 ENCOUNTER — Encounter (HOSPITAL_COMMUNITY): Payer: Self-pay | Admitting: Pharmacy Technician

## 2011-10-12 ENCOUNTER — Encounter (HOSPITAL_COMMUNITY): Payer: Self-pay | Admitting: Anesthesiology

## 2011-10-12 ENCOUNTER — Inpatient Hospital Stay (HOSPITAL_COMMUNITY)
Admission: RE | Admit: 2011-10-12 | Discharge: 2011-10-13 | DRG: 865 | Disposition: A | Discharge status: Home / Self Care | Payer: BC Managed Care – PPO | Source: Ambulatory Visit | Attending: Neurosurgery | Admitting: Neurosurgery

## 2011-10-12 ENCOUNTER — Ambulatory Visit (HOSPITAL_COMMUNITY): Payer: BC Managed Care – PPO

## 2011-10-12 ENCOUNTER — Ambulatory Visit (HOSPITAL_COMMUNITY): Payer: BC Managed Care – PPO | Admitting: Anesthesiology

## 2011-10-12 ENCOUNTER — Encounter (HOSPITAL_COMMUNITY): Payer: Self-pay | Admitting: *Deleted

## 2011-10-12 ENCOUNTER — Encounter (HOSPITAL_COMMUNITY)
Admission: RE | Disposition: A | Discharge status: Home / Self Care | Payer: Self-pay | Source: Ambulatory Visit | Attending: Neurosurgery

## 2011-10-12 DIAGNOSIS — M4722 Other spondylosis with radiculopathy, cervical region: Secondary | ICD-10-CM

## 2011-10-12 DIAGNOSIS — M47812 Spondylosis without myelopathy or radiculopathy, cervical region: Principal | ICD-10-CM | POA: Diagnosis present

## 2011-10-12 HISTORY — PX: ANTERIOR CERVICAL DECOMP/DISCECTOMY FUSION: SHX1161

## 2011-10-12 LAB — SURGICAL PCR SCREEN
MRSA, PCR: NEGATIVE
Staphylococcus aureus: NEGATIVE

## 2011-10-12 LAB — BASIC METABOLIC PANEL
Chloride: 102 mEq/L (ref 96–112)
GFR calc Af Amer: 68 mL/min — ABNORMAL LOW (ref >90)
Potassium: 3.6 mEq/L (ref 3.5–5.1)

## 2011-10-12 LAB — CBC
HCT: 33.2 % — ABNORMAL LOW (ref 36.0–46.0)
Hemoglobin: 11.2 g/dL — ABNORMAL LOW (ref 12.0–15.0)
WBC: 6.8 10*3/uL (ref 4.0–10.5)

## 2011-10-12 SURGERY — ANTERIOR CERVICAL DECOMPRESSION/DISCECTOMY FUSION 2 LEVELS
Anesthesia: General | Wound class: Clean

## 2011-10-12 MED ORDER — GLYCOPYRROLATE 0.2 MG/ML IJ SOLN
INTRAMUSCULAR | Status: DC | PRN
  Administered 2011-10-12: 0.4 mg via INTRAVENOUS

## 2011-10-12 MED ORDER — DEXAMETHASONE SODIUM PHOSPHATE 4 MG/ML IJ SOLN
INTRAMUSCULAR | Status: DC | PRN
  Administered 2011-10-12: 10 mg via INTRAVENOUS

## 2011-10-12 MED ORDER — ACETAMINOPHEN 650 MG RE SUPP: 650.0000 mg | RECTAL | Status: DC | PRN

## 2011-10-12 MED ORDER — HYDROMORPHONE HCL PF 1 MG/ML IJ SOLN
1.0000 mg | INTRAMUSCULAR | Status: DC | PRN
  Administered 2011-10-12: 1 mg via INTRAMUSCULAR
  Filled 2011-10-12: qty 1

## 2011-10-12 MED ORDER — SODIUM CHLORIDE 0.9 % IV SOLN
INTRAVENOUS | Status: AC
  Filled 2011-10-12: qty 500

## 2011-10-12 MED ORDER — HYDROCODONE-ACETAMINOPHEN 5-325 MG PO TABS
1.0000 | ORAL_TABLET | ORAL | Status: DC | PRN
  Administered 2011-10-12 – 2011-10-13 (×4): 2 via ORAL
  Filled 2011-10-12 (×4): qty 2

## 2011-10-12 MED ORDER — SODIUM CHLORIDE 0.9 % IR SOLN
Status: DC | PRN
  Administered 2011-10-12: 09:00:00

## 2011-10-12 MED ORDER — METHOCARBAMOL 500 MG PO TABS
500.0000 mg | ORAL_TABLET | Freq: Four times a day (QID) | ORAL | Status: DC | PRN
  Administered 2011-10-12 – 2011-10-13 (×2): 500 mg via ORAL
  Filled 2011-10-12 (×2): qty 1

## 2011-10-12 MED ORDER — HYDROMORPHONE HCL PF 1 MG/ML IJ SOLN
INTRAMUSCULAR | Status: AC
  Administered 2011-10-12: 0.5 mg via INTRAVENOUS
  Filled 2011-10-12: qty 1

## 2011-10-12 MED ORDER — PANTOPRAZOLE SODIUM 40 MG IV SOLR
40.0000 mg | Freq: Every day | INTRAVENOUS | Status: DC
  Administered 2011-10-12: 40 mg via INTRAVENOUS
  Filled 2011-10-12 (×2): qty 40

## 2011-10-12 MED ORDER — FENTANYL CITRATE 0.05 MG/ML IJ SOLN
INTRAMUSCULAR | Status: DC | PRN
  Administered 2011-10-12: 50 ug via INTRAVENOUS
  Administered 2011-10-12: 75 ug via INTRAVENOUS
  Administered 2011-10-12: 150 ug via INTRAVENOUS
  Administered 2011-10-12: 50 ug via INTRAVENOUS

## 2011-10-12 MED ORDER — ONDANSETRON HCL 4 MG/2ML IJ SOLN: 4.0000 mg | Freq: Once | INTRAMUSCULAR | Status: DC | PRN

## 2011-10-12 MED ORDER — CEFAZOLIN SODIUM 1-5 GM-% IV SOLN
INTRAVENOUS | Status: DC | PRN
  Administered 2011-10-12: 1 g via INTRAVENOUS

## 2011-10-12 MED ORDER — MENTHOL 3 MG MT LOZG
1.0000 | LOZENGE | OROMUCOSAL | Status: DC | PRN
  Administered 2011-10-12: 3 mg via ORAL
  Filled 2011-10-12: qty 9

## 2011-10-12 MED ORDER — MUPIROCIN 2 % EX OINT
TOPICAL_OINTMENT | Freq: Two times a day (BID) | CUTANEOUS | Status: DC
  Administered 2011-10-12: 1 via NASAL
  Filled 2011-10-12: qty 22

## 2011-10-12 MED ORDER — HYDROCHLOROTHIAZIDE 12.5 MG PO CAPS
12.5000 mg | ORAL_CAPSULE | Freq: Every day | ORAL | Status: DC
  Administered 2011-10-12 – 2011-10-13 (×2): 12.5 mg via ORAL
  Filled 2011-10-12 (×2): qty 1

## 2011-10-12 MED ORDER — MIDAZOLAM HCL 5 MG/5ML IJ SOLN
INTRAMUSCULAR | Status: DC | PRN
  Administered 2011-10-12: 2 mg via INTRAVENOUS

## 2011-10-12 MED ORDER — 0.9 % SODIUM CHLORIDE (POUR BTL) OPTIME
TOPICAL | Status: DC | PRN
  Administered 2011-10-12: 1000 mL

## 2011-10-12 MED ORDER — ONDANSETRON HCL 4 MG/2ML IJ SOLN: 4.0000 mg | INTRAMUSCULAR | Status: DC | PRN

## 2011-10-12 MED ORDER — KCL IN DEXTROSE-NACL 20-5-0.45 MEQ/L-%-% IV SOLN
80.0000 mL/h | INTRAVENOUS | Status: DC
  Filled 2011-10-12 (×3): qty 1000

## 2011-10-12 MED ORDER — METHOCARBAMOL 100 MG/ML IJ SOLN
500.0000 mg | Freq: Four times a day (QID) | INTRAVENOUS | Status: DC | PRN
  Administered 2011-10-12: 500 mg via INTRAVENOUS
  Filled 2011-10-12: qty 5

## 2011-10-12 MED ORDER — HYDROMORPHONE HCL PF 1 MG/ML IJ SOLN
0.2500 mg | INTRAMUSCULAR | Status: DC | PRN
  Administered 2011-10-12 (×4): 0.5 mg via INTRAVENOUS

## 2011-10-12 MED ORDER — PROPOFOL 10 MG/ML IV EMUL
INTRAVENOUS | Status: DC | PRN
  Administered 2011-10-12: 120 mg via INTRAVENOUS
  Administered 2011-10-12: 50 mg via INTRAVENOUS

## 2011-10-12 MED ORDER — CEFAZOLIN SODIUM 1-5 GM-% IV SOLN
1.0000 g | Freq: Three times a day (TID) | INTRAVENOUS | Status: AC
  Administered 2011-10-12 (×2): 1 g via INTRAVENOUS
  Filled 2011-10-12 (×2): qty 50

## 2011-10-12 MED ORDER — GABAPENTIN 300 MG PO CAPS
300.0000 mg | ORAL_CAPSULE | Freq: Three times a day (TID) | ORAL | Status: DC
  Administered 2011-10-12 – 2011-10-13 (×3): 300 mg via ORAL
  Filled 2011-10-12 (×5): qty 1

## 2011-10-12 MED ORDER — SODIUM CHLORIDE 0.9 % IJ SOLN: 3.0000 mL | INTRAMUSCULAR | Status: DC | PRN

## 2011-10-12 MED ORDER — ACETAMINOPHEN 10 MG/ML IV SOLN
INTRAVENOUS | Status: DC | PRN
  Administered 2011-10-12: 1000 mg via INTRAVENOUS

## 2011-10-12 MED ORDER — NEOSTIGMINE METHYLSULFATE 1 MG/ML IJ SOLN
INTRAMUSCULAR | Status: DC | PRN
  Administered 2011-10-12: 3 mg via INTRAVENOUS

## 2011-10-12 MED ORDER — BACITRACIN 50000 UNITS IM SOLR
INTRAMUSCULAR | Status: AC
  Filled 2011-10-12: qty 1

## 2011-10-12 MED ORDER — THROMBIN 5000 UNITS EX SOLR
CUTANEOUS | Status: DC | PRN
  Administered 2011-10-12 (×2): 5000 [IU] via TOPICAL

## 2011-10-12 MED ORDER — ROCURONIUM BROMIDE 100 MG/10ML IV SOLN
INTRAVENOUS | Status: DC | PRN
  Administered 2011-10-12: 20 mg via INTRAVENOUS
  Administered 2011-10-12: 50 mg via INTRAVENOUS
  Administered 2011-10-12: 5 mg via INTRAVENOUS

## 2011-10-12 MED ORDER — LACTATED RINGERS IV SOLN
INTRAVENOUS | Status: DC | PRN
  Administered 2011-10-12 (×2): via INTRAVENOUS

## 2011-10-12 MED ORDER — PHENYLEPHRINE HCL 10 MG/ML IJ SOLN
10.0000 mg | INTRAVENOUS | Status: DC | PRN
  Administered 2011-10-12: 25 ug/min via INTRAVENOUS

## 2011-10-12 MED ORDER — LIDOCAINE HCL (CARDIAC) 20 MG/ML IV SOLN
INTRAVENOUS | Status: DC | PRN
  Administered 2011-10-12: 100 mg via INTRAVENOUS

## 2011-10-12 MED ORDER — ACETAMINOPHEN 10 MG/ML IV SOLN
INTRAVENOUS | Status: AC
  Filled 2011-10-12: qty 100

## 2011-10-12 MED ORDER — DEXTROSE 5 % IV SOLN
INTRAVENOUS | Status: DC | PRN
  Administered 2011-10-12: 08:00:00 via INTRAVENOUS

## 2011-10-12 MED ORDER — SODIUM CHLORIDE 0.9 % IJ SOLN
3.0000 mL | Freq: Two times a day (BID) | INTRAMUSCULAR | Status: DC
  Administered 2011-10-12 (×2): 3 mL via INTRAVENOUS

## 2011-10-12 MED ORDER — PHENOL 1.4 % MT LIQD
1.0000 | OROMUCOSAL | Status: DC | PRN
  Administered 2011-10-12: 1 via OROMUCOSAL
  Filled 2011-10-12: qty 177

## 2011-10-12 MED ORDER — HEMOSTATIC AGENTS (NO CHARGE) OPTIME
TOPICAL | Status: DC | PRN
  Administered 2011-10-12: 1 via TOPICAL

## 2011-10-12 MED ORDER — ONDANSETRON HCL 4 MG/2ML IJ SOLN
INTRAMUSCULAR | Status: DC | PRN
  Administered 2011-10-12: 4 mg via INTRAVENOUS

## 2011-10-12 MED ORDER — ACETAMINOPHEN 325 MG PO TABS: 650.0000 mg | ORAL_TABLET | ORAL | Status: DC | PRN

## 2011-10-12 SURGICAL SUPPLY — 56 items
APL SKNCLS STERI-STRIP NONHPOA (GAUZE/BANDAGES/DRESSINGS) ×1
BAG DECANTER FOR FLEXI CONT (MISCELLANEOUS) ×2 IMPLANT
BENZOIN TINCTURE PRP APPL 2/3 (GAUZE/BANDAGES/DRESSINGS) ×4 IMPLANT
BIT DRILL TRINICA 2.3MM (BIT) IMPLANT
BRUSH SCRUB EZ PLAIN DRY (MISCELLANEOUS) ×2 IMPLANT
CANISTER SUCTION 2500CC (MISCELLANEOUS) ×2 IMPLANT
CLOTH BEACON ORANGE TIMEOUT ST (SAFETY) ×2 IMPLANT
CONT SPEC 4OZ CLIKSEAL STRL BL (MISCELLANEOUS) ×2 IMPLANT
DRAPE C-ARM 42X72 X-RAY (DRAPES) ×4 IMPLANT
DRAPE LAPAROTOMY 100X72 PEDS (DRAPES) ×2 IMPLANT
DRAPE MICROSCOPE LEICA (MISCELLANEOUS) ×1 IMPLANT
DRAPE MICROSCOPE ZEISS OPMI (DRAPES) ×1 IMPLANT
DRAPE SURG 17X23 STRL (DRAPES) ×4 IMPLANT
DRESSING TELFA 8X3 (GAUZE/BANDAGES/DRESSINGS) ×1 IMPLANT
DRILL BIT TRINICA 2.3MM (BIT)
ELECT COATED BLADE 2.86 ST (ELECTRODE) ×2 IMPLANT
ELECT REM PT RETURN 9FT ADLT (ELECTROSURGICAL) ×2
ELECTRODE REM PT RTRN 9FT ADLT (ELECTROSURGICAL) ×1 IMPLANT
GAUZE SPONGE 4X4 16PLY XRAY LF (GAUZE/BANDAGES/DRESSINGS) IMPLANT
GLOVE BIOGEL M 8.0 STRL (GLOVE) ×1 IMPLANT
GLOVE BIOGEL PI IND STRL 7.0 (GLOVE) IMPLANT
GLOVE BIOGEL PI INDICATOR 7.0 (GLOVE) ×2
GLOVE ECLIPSE 7.5 STRL STRAW (GLOVE) ×2 IMPLANT
GLOVE EXAM NITRILE LRG STRL (GLOVE) IMPLANT
GLOVE EXAM NITRILE MD LF STRL (GLOVE) ×2 IMPLANT
GLOVE EXAM NITRILE XL STR (GLOVE) IMPLANT
GLOVE EXAM NITRILE XS STR PU (GLOVE) IMPLANT
GLOVE SS BIOGEL STRL SZ 6.5 (GLOVE) IMPLANT
GLOVE SUPERSENSE BIOGEL SZ 6.5 (GLOVE) ×2
GOWN BRE IMP SLV AUR LG STRL (GOWN DISPOSABLE) ×1 IMPLANT
GOWN BRE IMP SLV AUR XL STRL (GOWN DISPOSABLE) ×2 IMPLANT
GOWN STRL REIN 2XL LVL4 (GOWN DISPOSABLE) ×1 IMPLANT
HEAD HALTER (SOFTGOODS) ×2 IMPLANT
INTERBODY TM 11X14X5-7DEG ANG (Metal Cage) ×2 IMPLANT
KIT BASIN OR (CUSTOM PROCEDURE TRAY) ×2 IMPLANT
KIT ROOM TURNOVER OR (KITS) ×2 IMPLANT
NDL SPNL 20GX3.5 QUINCKE YW (NEEDLE) ×1 IMPLANT
NEEDLE SPNL 20GX3.5 QUINCKE YW (NEEDLE) ×2 IMPLANT
NS IRRIG 1000ML POUR BTL (IV SOLUTION) ×2 IMPLANT
PACK LAMINECTOMY NEURO (CUSTOM PROCEDURE TRAY) ×2 IMPLANT
PAD ARMBOARD 7.5X6 YLW CONV (MISCELLANEOUS) ×2 IMPLANT
PATTIES SURGICAL .75X.75 (GAUZE/BANDAGES/DRESSINGS) ×1 IMPLANT
PUTTY BONE GRAFT KIT 2.5ML (Bone Implant) ×1 IMPLANT
RUBBERBAND STERILE (MISCELLANEOUS) ×4 IMPLANT
SPONGE GAUZE 4X4 12PLY (GAUZE/BANDAGES/DRESSINGS) ×2 IMPLANT
SPONGE INTESTINAL PEANUT (DISPOSABLE) ×2 IMPLANT
SPONGE SURGIFOAM ABS GEL SZ50 (HEMOSTASIS) ×2 IMPLANT
STRIP CLOSURE SKIN 1/2X4 (GAUZE/BANDAGES/DRESSINGS) ×2 IMPLANT
SUT PDS AB 5-0 P3 18 (SUTURE) ×2 IMPLANT
SUT VIC AB 3-0 CP2 18 (SUTURE) ×2 IMPLANT
SYR 20ML ECCENTRIC (SYRINGE) ×1 IMPLANT
TOOL MATCHSTK 3MM (MISCELLANEOUS) ×2 IMPLANT
TOWEL OR 17X24 6PK STRL BLUE (TOWEL DISPOSABLE) ×2 IMPLANT
TOWEL OR 17X26 10 PK STRL BLUE (TOWEL DISPOSABLE) ×2 IMPLANT
TRAP SPECIMEN MUCOUS 40CC (MISCELLANEOUS) ×1 IMPLANT
WATER STERILE IRR 1000ML POUR (IV SOLUTION) ×2 IMPLANT

## 2011-10-12 NOTE — Op Note (Signed)
Preop diagnosis: spondylosis C3-4 C5-6 Postop diagnosis: Same Procedure: C3-4 C5-6 decompressive anterior cervical discectomy with trabecular metal fusion at C3-4 and C5-6 anterior cervical plating Surgeon: Lakelynn Severtson Assistant: Botero  After being placed in the supine position in 5 pounds halter traction the patient's neck was prepped and draped in the usual sterile fashion localizing fluoroscopy was used prior to incision to identify the appropriate level. Transverse incision was made in the right anterior neck started at the midline and headed towards the medial aspect of the sternocleidomastoid muscle. The platysma muscle was incised transversely and the natural fascial plane between the strap muscles medially and the sternocleidomastoid laterally was followed down to the entrance but this cervical spine. Longus cole muscles were identified and split in the midline prescription light bilaterally with unipolar coagulation and Kidner dissection. The space at C3-4 and C5-6 were incised with 15 blade an approximately 90% of the disc material was removed at both levels. We then placed a self-retaining retractor at C3-4 and brought the microscope into the field. We used a high-speed drill to widen the space and date bony shavings for use later in the case. We then incised the posterior longitudinal ligament and removed to decompress the underlying spinal dura proximal foramen bilaterally. We followed the left C4 nerve root all the way out until was no longer compressed. On the right side we also decompressed denies aggressively since she was asymptomatic on that side. We then irrigated copiously and shows a 5 mm lordotic graft. We filled with a mixture of morcellized allograft at all this bone and impacted without difficulty. We then chose an appropriate length in a Tuck intracervical plate. We placed 4 drill holes and then placed 412 mm screws in excellent position. We then rotated the locking mechanism into the  locked position. With intertendinous C5-6 we did a similar discectomy. Once again aggressively decompress the spinal dura and foramen bilaterally but more so on the left, symptomatic side. Of the C6 nerve root well out its foramen. We then measured and chosen other 5 mm lordotic graft. After irrigating once more confirmed hemostasis the plug is intact to difficulty. We chose the appropriately length anterior cervical plate in place for holes and then placed 4 12 mm screws in good position. We rotated the locking mechanism and the locked position and final arthroscopy showed good placement of the plate screws and plugs at both levels. Irrigation was carried out. Because of the length of the exposure we chose to leave a prevertebral drain in the prevertebral space. We brought out through a separate stab incision. We then closed the incision with inverted Vicryl on the platysma muscle and inverted 5-0 PDS in the subcuticular layer. Steri-Strips were placed on the skin. We then placed a sterile dressing soft collar. The patient was extubated and taken to recovery in stable condition.

## 2011-10-12 NOTE — Plan of Care (Signed)
Problem: Consults Goal: Diagnosis - Spinal Surgery Outcome: Completed/Met Date Met:  10/12/11 Cervical Spine Fusion

## 2011-10-12 NOTE — Progress Notes (Signed)
Orthopedic Tech Progress Note Patient Details:  Crystal Stokes 05-06-1940 [id number]  Ortho Devices Type of Ortho Device: Soft collar Ortho Device/Splint Interventions: Application   Cammer, Mickie Bail 10/12/2011, 12:08 PM

## 2011-10-12 NOTE — Progress Notes (Signed)
UR COMPLETED  

## 2011-10-12 NOTE — Preoperative (Signed)
Beta Blockers   Reason not to administer Beta Blockers:Not Applicable 

## 2011-10-12 NOTE — Anesthesia Postprocedure Evaluation (Signed)
Anesthesia Post Note  Patient: Crystal Stokes  Procedure(s) Performed: Procedure(s) (LRB): ANTERIOR CERVICAL DECOMPRESSION/DISCECTOMY FUSION 2 LEVELS (N/A)  Anesthesia type: general  Patient location: PACU  Post pain: Pain level controlled  Post assessment: Patient's Cardiovascular Status Stable  Last Vitals:  Filed Vitals:   10/12/11 1200  BP: 134/67  Pulse: 72  Temp:   Resp: 12    Post vital signs: Reviewed and stable  Level of consciousness: sedated  Complications: No apparent anesthesia complications

## 2011-10-12 NOTE — Transfer of Care (Signed)
Immediate Anesthesia Transfer of Care Note  Patient: Crystal Stokes  Procedure(s) Performed: Procedure(s) (LRB): ANTERIOR CERVICAL DECOMPRESSION/DISCECTOMY FUSION 2 LEVELS (N/A)  Patient Location: PACU  Anesthesia Type: General  Level of Consciousness: awake, alert , oriented and patient cooperative  Airway & Oxygen Therapy: Patient Spontanous Breathing and Patient connected to nasal cannula oxygen  Post-op Assessment: Report given to PACU RN and Post -op Vital signs reviewed and stable  Post vital signs: Reviewed and stable  Complications: No apparent anesthesia complications

## 2011-10-12 NOTE — Anesthesia Procedure Notes (Signed)
Procedure Name: Intubation Date/Time: 10/12/2011 8:01 AM Performed by: Tyrone Nine Pre-anesthesia Checklist: Patient identified, Timeout performed, Emergency Drugs available, Suction available and Patient being monitored Patient Re-evaluated:Patient Re-evaluated prior to inductionOxygen Delivery Method: Circle system utilized Preoxygenation: Pre-oxygenation with 100% oxygen Intubation Type: IV induction Ventilation: Mask ventilation without difficulty Laryngoscope Size: Mac and 3 Grade View: Grade I Tube type: Oral Tube size: 7.5 mm Number of attempts: 1 Airway Equipment and Method: Stylet Placement Confirmation: ETT inserted through vocal cords under direct vision and breath sounds checked- equal and bilateral Secured at: 21 cm Tube secured with: Tape Dental Injury: Teeth and Oropharynx as per pre-operative assessment

## 2011-10-12 NOTE — H&P (Signed)
Crystal Stokes is an 71 y.o. female.   Chief Complaint: Left shoulder pain and neck pain HPI: Patient is a 71 year old female who presented with neck and left shoulder pain. She was found conservative therapy before she was sent to loss had an MRI scan that showed multiple levels of cervical spondylosis. We tried additional conservative therapy without improvement and therefore proceed with myelography for a more definitive diagnosis. This showed significant foraminal encroachment at C3-4 and C5-6 on the left, symptomatic side. After discussing the options she requested surgery and outcomes rate anterior cervical discectomy with fusion and plating. I have had a long discussion with her regarding the risks and benefits of surgical intervention. The risks discussed include but are not limited to bleeding infection weakness numbness paralysis spinal fluid leakage nonunion, hoarseness and death. We have discussed alternative methods of therapy all the risks and benefits of not intervention. She's had the opportunity to address numerous questions and appears to understand. With this information in hand she has requested that we proceed with surgery.  Past Medical History  Diagnosis Date  . Family history of colon cancer   . Hypertension   . Diverticulitis   . Legal blindness   . Hemorrhoids   . History of shingles   . Cataract     LEFT EYE REMOVED IN JAN  . Retinitis pigmentosa of both eyes     Past Surgical History  Procedure Date  . Hemorrhoid surgery   . Colonoscopy   . Cataracts JANUARY    LEFT EYE  . Retina implant   . Eye surgery     Cataract left    Family History  Problem Relation Age of Onset  . Colon cancer Mother    Social History:  reports that she has never smoked. She has never used smokeless tobacco. She reports that she drinks about 1.2 ounces of alcohol per week. She reports that she does not use illicit drugs.  Allergies:  Allergies  Allergen Reactions  .  Prednisone Itching, Nausea Only and Other (See Comments)    Severe headache, made her feel like she had the flu.  . Pregabalin Rash  . Valium (Diazepam)     Altered mental status    Medications Prior to Admission  Medication Sig Dispense Refill  . calcium carbonate (TUMS - DOSED IN MG ELEMENTAL CALCIUM) 500 MG chewable tablet Chew 1 tablet by mouth 2 (two) times daily.        . Cyanocobalamin (B-12) 5000 MCG SUBL Place 1 tablet under the tongue daily.      Marland Kitchen gabapentin (NEURONTIN) 300 MG capsule Take 1 capsule (300 mg total) by mouth 3 (three) times daily.  90 capsule  3  . hydrochlorothiazide (,MICROZIDE/HYDRODIURIL,) 12.5 MG capsule Take 12.5 mg by mouth daily.        Marland Kitchen HYDROcodone-acetaminophen (NORCO/VICODIN) 5-325 MG per tablet Take 0.5-1 tablets by mouth every 6 (six) hours as needed. For pain      . Pseudoephedrine-Acetaminophen (ALLEREST NO DROWSINESS PO) Take by mouth.      . ranitidine (ZANTAC) 150 MG capsule Take 150 mg by mouth 2 (two) times daily as needed. For acid reflux      . valACYclovir (VALTREX) 500 MG tablet Take 500 mg by mouth daily.        . Vitamin A 16109 UNITS TABS Take 1 tablet by mouth daily.         Results for orders placed during the hospital encounter of 10/12/11 (from the past  48 hour(s))  CBC     Status: Abnormal   Collection Time   10/12/11  6:32 AM      Component Value Range Comment   WBC 6.8  4.0 - 10.5 K/uL    RBC 3.44 (*) 3.87 - 5.11 MIL/uL    Hemoglobin 11.2 (*) 12.0 - 15.0 g/dL    HCT 29.5 (*) 62.1 - 46.0 %    MCV 96.5  78.0 - 100.0 fL    MCH 32.6  26.0 - 34.0 pg    MCHC 33.7  30.0 - 36.0 g/dL    RDW 30.8  65.7 - 84.6 %    Platelets 278  150 - 400 K/uL    Dg Chest 2 View  10/12/2011  *RADIOLOGY REPORT*  Clinical Data: Preoperative respiratory exam, cervical HNP  CHEST - 2 VIEW  Comparison: None.  Findings: The cardiac silhouette, mediastinum, pulmonary vasculature are within normal limits.  Both lungs are clear. There is no acute bony  abnormality.  IMPRESSION: There is no evidence of acute cardiac or pulmonary process.  Original Report Authenticated By: Brandon Melnick, M.D.    A comprehensive review of systems was negative.  Blood pressure 120/74, pulse 88, temperature 98.6 F (37 C), temperature source Oral, resp. rate 18, SpO2 99.00%.  The patient is awake alert and oriented. She has no facial asymmetry. Her gait is slow but nonantalgic. Her strength is intact. Reflexes are decreased but equal. Assessment/Plan Impression status cervical spondylosis at C3-4 and C5-6 on the left. Plan is for a 2 level anterior cervical discectomy with fusion and plating  Reinaldo Meeker, MD 10/12/2011, 7:40 AM

## 2011-10-12 NOTE — Anesthesia Preprocedure Evaluation (Addendum)
Anesthesia Evaluation  Patient identified by MRN, date of birth, ID band Patient awake    Reviewed: Allergy & Precautions, H&P , NPO status , Patient's Chart, lab work & pertinent test results  Airway Mallampati: II TM Distance: >3 FB Neck ROM: Full    Dental  (+) Teeth Intact, Caps and Dental Advisory Given   Pulmonary neg pulmonary ROS,    Pulmonary exam normal       Cardiovascular hypertension, Pt. on medications Rhythm:Regular     Neuro/Psych negative psych ROS   GI/Hepatic negative GI ROS, Neg liver ROS,   Endo/Other  negative endocrine ROS  Renal/GU negative Renal ROS  negative genitourinary   Musculoskeletal  (+) Arthritis -, Osteoarthritis,    Abdominal   Peds  Hematology negative hematology ROS (+)   Anesthesia Other Findings Pt is legally blind  Reproductive/Obstetrics negative OB ROS                         Anesthesia Physical Anesthesia Plan  ASA: II  Anesthesia Plan: General   Post-op Pain Management:    Induction: Intravenous  Airway Management Planned: Oral ETT  Additional Equipment:   Intra-op Plan:   Post-operative Plan: Extubation in OR  Informed Consent: I have reviewed the patients History and Physical, chart, labs and discussed the procedure including the risks, benefits and alternatives for the proposed anesthesia with the patient or authorized representative who has indicated his/her understanding and acceptance.     Plan Discussed with: CRNA and Surgeon  Anesthesia Plan Comments:         Anesthesia Quick Evaluation

## 2011-10-12 NOTE — Progress Notes (Signed)
PAGED DR Gerlene Fee, NO ANSWER.  CONSENT TO BE SIGNED IN OR.

## 2011-10-13 ENCOUNTER — Encounter (HOSPITAL_COMMUNITY): Payer: Self-pay | Admitting: Neurosurgery

## 2011-10-13 MED ORDER — PANTOPRAZOLE SODIUM 40 MG PO TBEC: 40.0000 mg | DELAYED_RELEASE_TABLET | Freq: Every day | ORAL | Status: DC

## 2011-10-13 NOTE — Progress Notes (Signed)
Utilization review completed. Alsie Younes, RN, BSN. 

## 2011-10-13 NOTE — Discharge Summary (Signed)
  Physician Discharge Summary  Patient ID: Crystal Stokes MRN: [id number] DOB/AGE: 11-06-1940 71 y.o.  Admit date: 10/12/2011 Discharge date: 10/13/2011  Admission Diagnoses:  Discharge Diagnoses:  Active Problems:  * No active hospital problems. *    Discharged Condition: good  Hospital Course: Surgery one day with 2 level acdf. Home the next doing well. Pain markedly improved. Neuro intact. Wound clean and dry. Home POD 1 markedly improved. Specific instructions given.  Consults: None  Significant Diagnostic Studies: none  Treatments: C 34 C 56 ACDF with plating  Discharge Exam: Blood pressure 119/67, pulse 82, temperature 98.2 F (36.8 C), temperature source Oral, resp. rate 18, SpO2 96.00%. Incision/Wound:clean and dry   Disposition: 01-Home or Self Care   Medication List  As of 10/13/2011 10:37 AM   ASK your doctor about these medications         ALLEREST NO DROWSINESS PO   Take by mouth.      B-12 5000 MCG Subl   Place 1 tablet under the tongue daily.      calcium carbonate 500 MG chewable tablet   Commonly known as: TUMS - dosed in mg elemental calcium   Chew 1 tablet by mouth 2 (two) times daily.      gabapentin 300 MG capsule   Commonly known as: NEURONTIN   Take 1 capsule (300 mg total) by mouth 3 (three) times daily.      hydrochlorothiazide 12.5 MG capsule   Commonly known as: MICROZIDE   Take 12.5 mg by mouth daily.      HYDROcodone-acetaminophen 5-325 MG per tablet   Commonly known as: NORCO/VICODIN   Take 0.5-1 tablets by mouth every 6 (six) hours as needed. For pain      ranitidine 150 MG capsule   Commonly known as: ZANTAC   Take 150 mg by mouth 2 (two) times daily as needed. For acid reflux      valACYclovir 500 MG tablet   Commonly known as: VALTREX   Take 500 mg by mouth daily.      Vitamin A 08657 UNITS Tabs   Take 1 tablet by mouth daily.             At home rest most of the time. Get up 9 or 10 times each day and take a 15  or 20 minute walk. No riding in the car and to your first postoperative appointment. If you have neck surgery you may shower from the chest down starting on the third postoperative day. If you had back surgery he may start showering on the third postoperative day with saran wrap wrapped around your incisional area 3 times. After the shower remove the saran wrap. Take pain medicine as needed and other medications as instructed. Call my office for an appointment.  SignedReinaldo Meeker, MD 10/13/2011, 10:37 AM

## 2011-10-23 NOTE — Progress Notes (Signed)
Received PA request from [facility] for Lidoderm patch for pt. Called to check w/pharmacist if it has prev been covered by ins d/t [employer] will only cover this for post-herpatic neuragia which pt does not have and this PA was denied a few months ago when Rx written. Was advised that this PA was denied in past and pt has p/up recently w/o coverage by ins.

## 2011-11-01 ENCOUNTER — Telehealth: Payer: Self-pay

## 2011-11-01 NOTE — Telephone Encounter (Signed)
Dr. Perrin Maltese or PA/MD    Pt is requesting a referral to [facility] for [facility] bladder test.  Home [phone]  Cell  [phone]

## 2011-11-01 NOTE — Telephone Encounter (Signed)
I have called patient, we have not evaluated her for this, she has to be seen before we can order scans for her. I have put her through to appt scheduling so she can schedule appt to be seen.

## 2011-11-02 ENCOUNTER — Encounter: Payer: Self-pay | Admitting: Family Medicine

## 2011-11-02 ENCOUNTER — Ambulatory Visit (INDEPENDENT_AMBULATORY_CARE_PROVIDER_SITE_OTHER): Payer: BC Managed Care – PPO | Admitting: Family Medicine

## 2011-11-02 VITALS — BP 111/72 | HR 73 | Temp 97.6°F | Resp 16 | Ht 64.5 in | Wt 145.0 lb

## 2011-11-02 DIAGNOSIS — R1011 Right upper quadrant pain: Secondary | ICD-10-CM

## 2011-11-02 DIAGNOSIS — G8929 Other chronic pain: Secondary | ICD-10-CM

## 2011-11-02 NOTE — Progress Notes (Signed)
  Subjective:    Patient ID: Crystal Stokes, female    DOB: December 31, 1940, 71 y.o.   MRN: [id number]  HPI This 71 y.o. Cauc female presents for evaluation of chronic severe epig and RUQ pain. She has  had this pain for > 1 year and was evaluated June 2012 with colonoscopy (Dr. Marina Goodell). She was   scheduled for CT of abd/pelvis last May but test was scheduled. She specifically requests a "scan to  look at the gallbladder"; her grandmother, father, brother and husband have all had gallbladder disease.  She notes that eating increases the pain and she has early satiety and mild dysphagia. Also having  nocturnal pain; no relief with OTC Zantac, Beano and other products. She takes Advil occasionally.  Last severe episode was ~ 6 weeks ago and lasted 4 hours. She denies diaphoresis, nausea or  vomiting; no radiation of pain. Stools are normal color.    Review of Systems  Constitutional: Positive for appetite change. Negative for fever, diaphoresis, fatigue and unexpected weight change.  Respiratory: Negative for cough, chest tightness and shortness of breath.   Cardiovascular: Negative for chest pain and palpitations.  Gastrointestinal: Positive for abdominal pain. Negative for nausea, vomiting, diarrhea, constipation, blood in stool and anal bleeding.  Musculoskeletal: Negative for back pain.  Skin: Negative for color change.  Hematological: Negative.        Objective:   Physical Exam  Nursing note and vitals reviewed. Constitutional: She is oriented to person, place, and time. She appears well-developed and well-nourished. No distress.  HENT:  Head: Normocephalic and atraumatic.  Eyes: Conjunctivae and EOM are normal. No scleral icterus.  Cardiovascular: Normal rate, regular rhythm and normal heart sounds.  Exam reveals no gallop.   No murmur heard. Pulmonary/Chest: Effort normal and breath sounds normal. No respiratory distress.  Abdominal: Normal appearance. She exhibits no distension,  no pulsatile liver, no ascites, no pulsatile midline mass and no mass. Bowel sounds are decreased. There is no hepatosplenomegaly. There is tenderness in the right upper quadrant and epigastric area. There is guarding. There is no rigidity, no rebound and no CVA tenderness.       Murphy's sign equivocal  Neurological: She is alert and oriented to person, place, and time. No cranial nerve deficit.  Skin: Skin is warm and dry. No pallor.  Psychiatric: She has a normal mood and affect. Her behavior is normal. Judgment and thought content normal.    LABS: CMET (Feb 2013)- normal    CBC (Oct 12, 2011)- Hgb= 11.2 ; normal WBC      Assessment & Plan:   1. Abdominal pain, chronic, right upper quadrant - significant family history of gallbladder disease US Abdomen Limited RUQ to R/O chronic cholecystitis or gallstones; if [place] is negative, consider HIDA scan

## 2011-11-02 NOTE — Patient Instructions (Signed)
Gallbladder Disease Gallbladder disease (cholecystitis) is an inflammation of your gallbladder. It is usually caused by a build-up of stones (gallstones) or sludge (cholelithiasis) in your gallbladder. The gallbladder is not an essential organ. It is located slightly to the right of center in the belly (abdomen), behind the liver. It stores bile made in the liver. Bile aids in digestion of fats. Gallbladder disease may result in nausea (feeling sick to your stomach), abdominal pain, and jaundice. In severe cases, emergency surgery may be required. The most common type of gallbladder disease is gallstones. They begin as small crystals and slowly grow into stones. Gallstone pain occurs when the bile duct has spasms. The spasms are caused by the stone passing out of the duct. The stone is trying to pass at the same time bile is passing into the small bowel for digestion. The pain usually begins suddenly. It may persist from several minutes to several hours. Infection can occur. Infection can add to discomfort and severity of an acute attack. The pain may be made worse by breathing deeply or by being jarred. There may be fever and tenderness to the touch. In some cases, when gallstones do not move into the bile duct, people have no pain or symptoms. These are called "silent" gallstones. Women are three times more likely to develop gallstones than men. Women who have had several pregnancies are more likely to have gallbladder disease. Physicians sometimes advise removing diseased gallbladders before future pregnancies. Other factors that increase the risk of gallbladder disease are obesity, diets heavy in fried foods and dairy products, increasing age, prolonged use of medications containing female hormones, and heredity. HOME CARE INSTRUCTIONS   If your physician prescribed an antibiotic, take as directed.   Only take over-the-counter or prescription medicines for pain, discomfort, or fever as directed by your  caregiver.   Follow a low fat diet until seen again. (Fat causes the gallbladder to contract.)   Follow-up as instructed. Attacks are almost always recurrent and surgery is usually required for permanent treatment.  SEEK IMMEDIATE MEDICAL CARE IF:   Pain is increasing and not controlled by medications.   The pain moves to another part of your abdomen or to your back. (Right sided pain can be appendicitis and left sided pain in adults can be diverticulitis).   You have a fever.   You develop nausea and vomiting.  Document Released: 02/27/2005 Document Revised: 02/16/2011 Document Reviewed: 01/13/2011 [facility] Patient Information 2012 [place], [place].    Gallbladder Nuclear Scanning This is a test in which a radioactive substance is injected into the blood. The material injected is not harmful to you. This material then concentrates in the bile. Within approximately 1 hour, your gallbladder and the ducts leading to and from it and into the first part of the small bowel, can be visualized by an x-ray scan.  PREPARATION FOR TEST No preparation or fasting is necessary. NORMAL FINDINGS Gallbladder, common bile duct, and duodenum can be visualized within 60 minutes after radionuclide injection. (This confirms the cystic and common bile ducts which lead from the liver and gallbladder are open and allowing bile to pass.) Ranges for normal findings may vary among different laboratories and hospitals. You should always check with your doctor after having lab work or other tests done to discuss the meaning of your test results and whether your values are considered within normal limits. MEANING OF TEST  Your caregiver will go over the test results with you and discuss the importance and meaning  of your results, as well as treatment options and the need for additional tests if necessary. OBTAINING THE TEST RESULTS  It is your responsibility to obtain your test results. Ask the lab or department  performing the test when and how you will get your results. Document Released: 02/22/2005 Document Revised: 02/16/2011 Document Reviewed: 02/08/2008 [facility] Patient Information 2012 [place], [place].

## 2011-11-06 ENCOUNTER — Ambulatory Visit
Admission: RE | Admit: 2011-11-06 | Discharge: 2011-11-06 | Disposition: A | Discharge status: Home / Self Care | Payer: BC Managed Care – PPO | Source: Ambulatory Visit | Attending: Family Medicine | Admitting: Family Medicine

## 2011-11-06 ENCOUNTER — Telehealth: Payer: Self-pay

## 2011-11-06 DIAGNOSIS — R1011 Right upper quadrant pain: Secondary | ICD-10-CM

## 2011-11-06 NOTE — Telephone Encounter (Signed)
LMOM for patient to CB.  Call report was normal, please notify patient.  Also, report was faxed to Dr. Gerrit Friends.  Please send to Dr. Audria Nine after patient is notified.

## 2011-11-06 NOTE — Telephone Encounter (Signed)
Patient notified results and that MD will review this tomorrow afternoon and advise further.

## 2011-11-06 NOTE — Telephone Encounter (Signed)
Pt states dr Audria Nine sent her for imaging studies of her liver and gallbladder today and she would like to know when results are in and to have those results forwarded to Dr. Darnell Level.  Pt best: [phone]  bf

## 2011-11-08 NOTE — Telephone Encounter (Signed)
I spoke with pt and review GB ultrasound result (negative). She wants to set up an appointment with Dr. Marina Goodell (GI) who did her colonoscopy last year. I agree with that as next step and she will let me know the outcome of that evaluation.

## 2011-11-10 ENCOUNTER — Other Ambulatory Visit: Payer: Self-pay

## 2011-11-10 MED ORDER — HYDROCODONE-ACETAMINOPHEN 5-325 MG PO TABS: 0.5000 | ORAL_TABLET | Freq: Four times a day (QID) | ORAL | Status: DC | PRN

## 2011-11-21 ENCOUNTER — Other Ambulatory Visit: Payer: Self-pay | Admitting: Family Medicine

## 2011-11-21 MED ORDER — HYDROCODONE-ACETAMINOPHEN 5-325 MG PO TABS: 0.5000 | ORAL_TABLET | Freq: Four times a day (QID) | ORAL | Status: DC | PRN

## 2011-12-13 ENCOUNTER — Other Ambulatory Visit: Payer: Self-pay | Admitting: Dermatology

## 2012-01-21 ENCOUNTER — Telehealth: Payer: Self-pay | Admitting: Internal Medicine

## 2012-01-21 MED ORDER — AZITHROMYCIN 500 MG PO TABS: 500.0000 mg | ORAL_TABLET | Freq: Every day | ORAL | Status: DC

## 2012-01-21 MED ORDER — HYDROCODONE-ACETAMINOPHEN 7.5-325 MG/15ML PO SOLN: 15.0000 mL | Freq: Four times a day (QID) | ORAL | Status: DC | PRN

## 2012-01-21 MED ORDER — HYDROCODONE-ACETAMINOPHEN 5-325 MG PO TABS: 1.0000 | ORAL_TABLET | Freq: Four times a day (QID) | ORAL | Status: DC | PRN

## 2012-01-21 NOTE — Telephone Encounter (Signed)
Has fever, cough, body aches , yellow nasal and sputum discharge. No sob, cp, or severe ha.  Called in Zithromax 500mg  5, lortab elixir, vicodin 5/325. She will come see me if not better in 2-3 days.

## 2012-02-22 ENCOUNTER — Other Ambulatory Visit: Payer: Self-pay | Admitting: Internal Medicine

## 2012-02-22 NOTE — Telephone Encounter (Signed)
Paper chart pulled at nurses station pa pool. [id number]

## 2012-02-22 NOTE — Telephone Encounter (Signed)
Please pull paper chart.  

## 2012-02-23 NOTE — Telephone Encounter (Signed)
Needs office visit.

## 2012-04-23 ENCOUNTER — Other Ambulatory Visit: Payer: Self-pay | Admitting: Physician Assistant

## 2012-06-10 ENCOUNTER — Other Ambulatory Visit: Payer: Self-pay | Admitting: Physician Assistant

## 2012-08-03 ENCOUNTER — Ambulatory Visit (INDEPENDENT_AMBULATORY_CARE_PROVIDER_SITE_OTHER): Payer: BC Managed Care – PPO | Admitting: Physician Assistant

## 2012-08-03 VITALS — BP 116/74 | HR 63 | Temp 98.0°F | Resp 16 | Ht 62.0 in | Wt 152.0 lb

## 2012-08-03 DIAGNOSIS — H6120 Impacted cerumen, unspecified ear: Secondary | ICD-10-CM

## 2012-08-03 DIAGNOSIS — H9201 Otalgia, right ear: Secondary | ICD-10-CM

## 2012-08-03 DIAGNOSIS — H9209 Otalgia, unspecified ear: Secondary | ICD-10-CM

## 2012-08-03 DIAGNOSIS — H6121 Impacted cerumen, right ear: Secondary | ICD-10-CM

## 2012-08-03 NOTE — Progress Notes (Signed)
   [address], [place] [address]   Phone [phone]  Subjective:    Patient ID: Crystal Stokes, female    DOB: [birth year], 72 y.o.   MRN: [id number]  HPI Pt presents to clinic with r ear pain for the last couple of days.  On Tuesday she had pain but it then resolved until this am when it started again.  It feels like it is in the canal.  She is having no hearing problems.  She has had no trauma to the ear.  She has had no recent colds.   Review of Systems  Constitutional: Negative for fever and chills.  HENT: Positive for ear pain. Negative for hearing loss, congestion and ear discharge.        Objective:   Physical Exam  Vitals reviewed. Constitutional: She is oriented to person, place, and time. She appears well-developed and well-nourished.  HENT:  Head: Normocephalic and atraumatic.  Right Ear: Hearing and external ear normal. A foreign body (cerumen - removed with lavage - pt feels slightly better - the ear canal is slightly erythematous after lavage) is present.  Left Ear: Hearing, tympanic membrane, external ear and ear canal normal.  Pulmonary/Chest: Effort normal.  Neurological: She is alert and oriented to person, place, and time.  Skin: Skin is warm and dry.  Psychiatric: She has a normal mood and affect. Her behavior is normal. Judgment and thought content normal.       Assessment & Plan:  Cerumen impaction, right - resolved  Ear pain, right  Benny Lennert PA-C 08/03/2012 10:48 AM

## 2012-09-02 ENCOUNTER — Other Ambulatory Visit: Payer: Self-pay | Admitting: Physician Assistant

## 2012-09-11 ENCOUNTER — Telehealth: Payer: Self-pay | Admitting: Radiology

## 2012-09-11 MED ORDER — HYDROCODONE-ACETAMINOPHEN 5-325 MG PO TABS: 1.0000 | ORAL_TABLET | Freq: Four times a day (QID) | ORAL | Status: DC | PRN

## 2012-09-11 MED ORDER — CYCLOBENZAPRINE HCL 10 MG PO TABS: 5.0000 mg | ORAL_TABLET | Freq: Two times a day (BID) | ORAL | Status: DC | PRN

## 2012-09-11 NOTE — Telephone Encounter (Signed)
Dr Perrin Maltese called in meds for this patient, he called me to advise, Amy

## 2012-09-15 ENCOUNTER — Ambulatory Visit (INDEPENDENT_AMBULATORY_CARE_PROVIDER_SITE_OTHER): Payer: BC Managed Care – PPO | Admitting: Internal Medicine

## 2012-09-15 ENCOUNTER — Ambulatory Visit: Payer: BC Managed Care – PPO

## 2012-09-15 VITALS — BP 110/62 | HR 70 | Temp 98.1°F | Resp 18 | Ht 63.5 in | Wt 150.0 lb

## 2012-09-15 DIAGNOSIS — S8010XA Contusion of unspecified lower leg, initial encounter: Secondary | ICD-10-CM

## 2012-09-15 DIAGNOSIS — M25562 Pain in left knee: Secondary | ICD-10-CM

## 2012-09-15 DIAGNOSIS — S81001A Unspecified open wound, right knee, initial encounter: Secondary | ICD-10-CM

## 2012-09-15 DIAGNOSIS — M25569 Pain in unspecified knee: Secondary | ICD-10-CM

## 2012-09-15 DIAGNOSIS — S8002XA Contusion of left knee, initial encounter: Secondary | ICD-10-CM

## 2012-09-15 DIAGNOSIS — S81809A Unspecified open wound, unspecified lower leg, initial encounter: Secondary | ICD-10-CM

## 2012-09-15 DIAGNOSIS — S81009A Unspecified open wound, unspecified knee, initial encounter: Secondary | ICD-10-CM

## 2012-09-15 MED ORDER — MUPIROCIN 2 % EX OINT: TOPICAL_OINTMENT | Freq: Three times a day (TID) | CUTANEOUS | Status: DC

## 2012-09-15 MED ORDER — DOXYCYCLINE HYCLATE 100 MG PO TABS: 100.0000 mg | ORAL_TABLET | Freq: Two times a day (BID) | ORAL | Status: DC

## 2012-09-15 NOTE — Patient Instructions (Addendum)
Knee, Cartilage (Meniscus) Injury It is suspected that you have a torn cartilage (meniscus) in your knee. The menisci are made of tough cartilage, and fit between the surfaces of the thigh and leg bones. The menisci are "C"shaped and have a wedged profile. The wedged profile helps the stability of the joint by keeping the rounded femur surface from sliding off the flat tibial surface. The menisci are fed (nourished) by small blood vessels; but there is also a large area at the inner edge of the meniscus that does not have a good blood supply (avascular). This presents a problem when there is an injury to the meniscus, because areas without good blood supply heal poorly. As a result when there is a torn cartilage in the knee, surgery is often required to fix it. This is usually done with a surgical procedure less invasive than open surgery (arthroscopy). Some times open surgery of the knee is required if there is other damage. PURPOSE OF THE MENISCUS The medial meniscus rests on the medial tibial plateau. The tibia is the large bone in your lower leg (the shin bone). The medial tibial plateau is the upper end of the bone making up the inner part of your knee. The lateral meniscus serves the same purpose and is located on the outside of the knee. The menisci help to distribute your body weight across the knee joint; they act as shock absorbers. Without the meniscus present, the weight of your body would be unevenly applied to the bones in your legs (the femur and tibia). The femur is the large bone in your thigh. This uneven weight distribution would cause increased wear and tear on the cartilage lining the joint surfaces, leading to early damage (arthritis) of these areas. The presence of the menisci cartilage is necessary for a healthy knee. PURPOSE OF THE KNEE CARTILAGE The knee joint is made up of three bones: the thigh bone (femur), the shin bone (tibia), and the knee cap (patella). The surfaces of these  bones at the knee joint are covered with cartilage called articular cartilage. This smooth slippery surface allows the bones to slide against each other without causing bone damage. The meniscus sits between these cartilaginous surfaces of the bones. It distributes the weight evenly in the joints and helps with the stability of the joint (keeps the joint steady). HOME CARE INSTRUCTIONS  Use crutches and external braces as instructed.  Once home an ice pack applied to your injured knee may help with discomfort and keep the swelling down. An ice pack can be used for the first couple of days or as instructed.  Only take over-the-counter or prescription medicines for pain, discomfort, or fever as directed by your caregiver.  Call if you do not have relief of pain with medications or if there is increasing in pain.  Call if your foot becomes cold or blue.  You may resume normal diet and activities as directed.  Make sure to keep your appointment with your follow-up caregiver. This injury may require further evaluation and treatment beyond the temporary treatment given today. Document Released: 05/20/2002 Document Revised: 05/22/2011 Document Reviewed: 09/11/2008 [facility] Patient Information 2014 [place], [place]. Wound Care Wound care helps prevent pain and infection.  You may need a tetanus shot if:  You cannot remember when you had your last tetanus shot.  You have never had a tetanus shot.  The injury broke your skin. If you need a tetanus shot and you choose not to have one, you may  get tetanus. Sickness from tetanus can be serious. HOME CARE   Only take medicine as told by your doctor.  Clean the wound daily with mild soap and water.  Change any bandages (dressings) as told by your doctor.  Put medicated cream and a bandage on the wound as told by your doctor.  Change the bandage if it gets wet, dirty, or starts to smell.  Take showers. Do not take baths, swim, or do anything  that puts your wound under water.  Rest and raise (elevate) the wound until the pain and puffiness (swelling) are better.  Keep all doctor visits as told. GET HELP RIGHT AWAY IF:   Yellowish-white fluid (pus) comes from the wound.  Medicine does not lessen your pain.  There is a red streak going away from the wound.  You have a fever. MAKE SURE YOU:   Understand these instructions.  Will watch your condition.  Will get help right away if you are not doing well or get worse. Document Released: 12/07/2007 Document Revised: 05/22/2011 Document Reviewed: 07/03/2010 [facility] Patient Information 2014 [place], [place].

## 2012-09-15 NOTE — Progress Notes (Signed)
  Subjective:    Patient ID: Crystal Stokes, female    DOB: [birth year], 72 y.o.   MRN: [id number]  HPI At her office and tripped over parking cement block. Fell 10-14d ago. Wound is healing but pain is persisting. Knee feels warm to touch around wound but no effusion and redness suggestive of infection. Can walk with not much increase in pain  Review of Systems     Objective:   Physical Exam  Vitals reviewed. Constitutional: She is oriented to person, place, and time. She appears well-developed and well-nourished. No distress.  HENT:  Right Ear: External ear normal.  Left Ear: External ear normal.  Eyes: EOM and lids are normal.  Bilateral vision loss/retinitis pigmentosa  Cardiovascular: Normal rate.   Pulmonary/Chest: Effort normal and breath sounds normal.  Abdominal: Soft.  Musculoskeletal: She exhibits edema and tenderness.       Left knee: She exhibits laceration, erythema and bony tenderness. She exhibits normal range of motion, no swelling, no effusion, no deformity, no LCL laxity and normal patellar mobility. Tenderness found. Lateral joint line tenderness noted. No patellar tendon tenderness noted.  Neurological: She is alert and oriented to person, place, and time. She exhibits normal muscle tone. Coordination normal.  Skin: Abrasion and bruising noted. No rash noted. There is erythema.     Wound, warm, not cellulitic , healing  Psychiatric: She has a normal mood and affect. Her behavior is normal.    [facility] reading (PRIMARY) by  Dr Perrin Maltese no fx seen, mild DJD        Assessment & Plan:  Wound knee Pain knee/Contusion Wound care/Bactroban/Doxy 5d Knee sleeve

## 2012-09-30 ENCOUNTER — Telehealth: Payer: Self-pay

## 2012-09-30 NOTE — Telephone Encounter (Signed)
Okey Regal is calling to check on the status of the release of information for her patient Crystal Stokes.  Call back number is [phone] ext [phone] Fax number is [phone]

## 2012-10-02 NOTE — Telephone Encounter (Signed)
Left message for Crystal Stokes. The only immunization in patient chart is pneumovax from 2007.

## 2012-10-11 DIAGNOSIS — Z0271 Encounter for disability determination: Secondary | ICD-10-CM

## 2012-12-21 ENCOUNTER — Other Ambulatory Visit: Payer: Self-pay | Admitting: Physician Assistant

## 2013-02-17 ENCOUNTER — Telehealth: Payer: Self-pay

## 2013-02-17 NOTE — Telephone Encounter (Signed)
Last visit was in July, to order MRI will need more recent visit. Have provided her Dr Perrin Maltese hours.

## 2013-02-17 NOTE — Telephone Encounter (Signed)
Pt requesting mri order from dr guest(he has seen pt on 2 occasions for neck issues)  Best phone for pt is [phone] or [phone]

## 2013-02-18 ENCOUNTER — Ambulatory Visit: Payer: BC Managed Care – PPO

## 2013-02-18 ENCOUNTER — Ambulatory Visit (INDEPENDENT_AMBULATORY_CARE_PROVIDER_SITE_OTHER): Payer: BC Managed Care – PPO | Admitting: Internal Medicine

## 2013-02-18 VITALS — BP 110/66 | HR 82 | Temp 98.7°F | Resp 16 | Ht 64.5 in | Wt 152.0 lb

## 2013-02-18 DIAGNOSIS — M5090 Cervical disc disorder, unspecified, unspecified cervical region: Secondary | ICD-10-CM

## 2013-02-18 NOTE — Progress Notes (Signed)
   Subjective:    Patient ID: Crystal Stokes, female    DOB: 06-13-1940, 72 y.o.   MRN: [id number]  HPI Patient presents today with neck pain. She had surgery on her neck 1 year ago. She states her neck is hurting as bad today as it was 1 year ago prior to surgery. She wants to see if she has a bone spur in her neck. She is requesting a MRI to see if there are any changes that may need surgery. She states that the pain is on the right side of there neck and the pain shoots down the right side to the shoulder. She has pain more when she puts her head to her chest but no significant loss of range of motion.  The pain is more on the right, is worse than before surgery, her NS is not helpful to her now. Gabapentin helps the pain. No weakness, numbness or incontinence.   Review of Systems htn controlled    Objective:   Physical Exam  Vitals reviewed. Constitutional: She is oriented to person, place, and time. She appears well-developed and well-nourished.  HENT:  Head: Normocephalic.  Eyes: Conjunctivae and EOM are normal. Pupils are equal, round, and reactive to light.  Pulmonary/Chest: Effort normal.  Musculoskeletal:       Cervical back: She exhibits decreased range of motion, tenderness, bony tenderness, pain and spasm. She exhibits no swelling, no edema, no deformity, no laceration and normal pulse.  Neurological: She is alert and oriented to person, place, and time. She has normal strength and normal reflexes. A cranial nerve deficit is present. No sensory deficit. She exhibits normal muscle tone. Coordination normal.  Blind retinitis pigmentosa  Psychiatric: She has a normal mood and affect. Her behavior is normal.   [facility] reading (PRIMARY) by  Crystal Stokes post surgical, spondylosis                Assessment & Plan:  Refer for MRI Gabapentin/flexeril Consider Crystal. Higinio Roger

## 2013-02-18 NOTE — Patient Instructions (Signed)

## 2013-02-27 ENCOUNTER — Other Ambulatory Visit: Payer: Self-pay | Admitting: Obstetrics and Gynecology

## 2013-03-01 ENCOUNTER — Ambulatory Visit
Admission: RE | Admit: 2013-03-01 | Discharge: 2013-03-01 | Disposition: A | Discharge status: Home / Self Care | Payer: BC Managed Care – PPO | Source: Ambulatory Visit | Attending: Internal Medicine | Admitting: Internal Medicine

## 2013-03-01 DIAGNOSIS — M5090 Cervical disc disorder, unspecified, unspecified cervical region: Secondary | ICD-10-CM

## 2013-03-17 ENCOUNTER — Other Ambulatory Visit: Payer: BC Managed Care – PPO

## 2013-03-18 ENCOUNTER — Ambulatory Visit
Admission: RE | Admit: 2013-03-18 | Discharge: 2013-03-18 | Disposition: A | Discharge status: Home / Self Care | Payer: BC Managed Care – PPO | Source: Ambulatory Visit | Attending: Internal Medicine | Admitting: Internal Medicine

## 2013-03-19 ENCOUNTER — Telehealth: Payer: Self-pay

## 2013-03-19 NOTE — Telephone Encounter (Signed)
.   Anterior cervical fusion at C3-4 and C5-6.  2. Cervical spondylosis diffusely throughout the cervical spine as  described above.  Left message for her to call me back.

## 2013-03-19 NOTE — Telephone Encounter (Signed)
Patient states that an MRI report on her neck was sent over and is waiting for the results.  [phone]

## 2013-03-20 ENCOUNTER — Telehealth: Payer: Self-pay

## 2013-03-20 NOTE — Telephone Encounter (Signed)
Patient called back, she just wants me to leave a copy at front desk for pick up this is done.

## 2013-03-20 NOTE — Telephone Encounter (Signed)
Called patient to advise it is at front desk for pick up .

## 2013-03-20 NOTE — Telephone Encounter (Signed)
Patient is asking if there is a written report along with her MRI that she had done 03/18/13. She says she received the disc but no report. Please verify if it has been reviewed by the dr. Patient would like a copy afterwards for pick up today. Please advise thank you!   [phone]

## 2013-03-25 ENCOUNTER — Other Ambulatory Visit: Payer: Self-pay | Admitting: Internal Medicine

## 2013-03-25 MED ORDER — CYCLOBENZAPRINE HCL 10 MG PO TABS: 5.0000 mg | ORAL_TABLET | Freq: Two times a day (BID) | ORAL | Status: DC | PRN

## 2013-04-24 ENCOUNTER — Other Ambulatory Visit: Payer: Self-pay | Admitting: Internal Medicine

## 2013-04-29 ENCOUNTER — Telehealth: Payer: Self-pay

## 2013-04-29 NOTE — Telephone Encounter (Signed)
Pt request for Dr. Elder Cyphers to call. Crystal Stokes [birth year], Crystal fell last night, may need xray.

## 2013-06-20 ENCOUNTER — Ambulatory Visit (INDEPENDENT_AMBULATORY_CARE_PROVIDER_SITE_OTHER): Payer: BC Managed Care – PPO | Admitting: Internal Medicine

## 2013-06-20 VITALS — BP 148/82 | HR 73 | Temp 98.1°F | Resp 16 | Wt 152.0 lb

## 2013-06-20 DIAGNOSIS — R059 Cough, unspecified: Secondary | ICD-10-CM

## 2013-06-20 DIAGNOSIS — R509 Fever, unspecified: Secondary | ICD-10-CM

## 2013-06-20 DIAGNOSIS — R05 Cough: Secondary | ICD-10-CM

## 2013-06-20 MED ORDER — AZITHROMYCIN 500 MG PO TABS: 500.0000 mg | ORAL_TABLET | Freq: Every day | ORAL | Status: DC

## 2013-06-20 MED ORDER — HYDROCODONE-ACETAMINOPHEN 7.5-325 MG/15ML PO SOLN: 5.0000 mL | Freq: Four times a day (QID) | ORAL | Status: DC | PRN

## 2013-06-20 NOTE — Progress Notes (Signed)
   Subjective:    Patient ID: Crystal Stokes, female    DOB: 11-07-1940, 73 y.o.   MRN: [id number]  HPI pt here c/o of sinus pain and pressure, cough (clear), and body aches, low grade fever because her normal is 96.5. Started 5 days ago. otc zyrtec with some relief. Pt had same symptoms a month ago and was prescribed amoxicillin. She is now taking it but it is not helping.  Persistent sxs progressive over 5 days. No sob, cp.   Review of Systems     Objective:   Physical Exam  Constitutional: She is oriented to person, place, and time. She appears well-developed and well-nourished. She appears ill. No distress.  HENT:  Head: Normocephalic.  Right Ear: External ear normal.  Left Ear: External ear normal.  Mouth/Throat: Oropharynx is clear and moist.  Eyes: EOM are normal. Pupils are equal, round, and reactive to light.  She has lost her vision both eyes due to retinitis pigmentosa  Neck: Normal range of motion. Neck supple.  Cardiovascular: Normal rate, regular rhythm and normal heart sounds.   Pulmonary/Chest: Effort normal. Not tachypneic. She has no decreased breath sounds. She has no wheezes. She has rhonchi. She has no rales.  Neurological: She is alert and oriented to person, place, and time. She exhibits normal muscle tone. Coordination normal.  Skin: No rash noted.  Psychiatric: She has a normal mood and affect. Her behavior is normal. Judgment and thought content normal.          Assessment & Plan:  Bronchitis

## 2013-06-20 NOTE — Patient Instructions (Signed)
Acute Bronchitis Bronchitis is inflammation of the airways that extend from the windpipe into the lungs (bronchi). The inflammation often causes mucus to develop. This leads to a cough, which is the most common symptom of bronchitis.  In acute bronchitis, the condition usually develops suddenly and goes away over time, usually in a couple weeks. Smoking, allergies, and asthma can make bronchitis worse. Repeated episodes of bronchitis may cause further lung problems.  CAUSES Acute bronchitis is most often caused by the same virus that causes a cold. The virus can spread from person to person (contagious).  SIGNS AND SYMPTOMS   Cough.   Fever.   Coughing up mucus.   Body aches.   Chest congestion.   Chills.   Shortness of breath.   Sore throat.  DIAGNOSIS  Acute bronchitis is usually diagnosed through a physical exam. Tests, such as chest X-rays, are sometimes done to rule out other conditions.  TREATMENT  Acute bronchitis usually goes away in a couple weeks. Often times, no medical treatment is necessary. Medicines are sometimes given for relief of fever or cough. Antibiotics are usually not needed but may be prescribed in certain situations. In some cases, an inhaler may be recommended to help reduce shortness of breath and control the cough. A cool mist vaporizer may also be used to help thin bronchial secretions and make it easier to clear the chest.  HOME CARE INSTRUCTIONS  Get plenty of rest.   Drink enough fluids to keep your urine clear or pale yellow (unless you have a medical condition that requires fluid restriction). Increasing fluids may help thin your secretions and will prevent dehydration.   Only take over-the-counter or prescription medicines as directed by your health care provider.   Avoid smoking and secondhand smoke. Exposure to cigarette smoke or irritating chemicals will make bronchitis worse. If you are a smoker, consider using nicotine gum or skin  patches to help control withdrawal symptoms. Quitting smoking will help your lungs heal faster.   Reduce the chances of another bout of acute bronchitis by washing your hands frequently, avoiding people with cold symptoms, and trying not to touch your hands to your mouth, nose, or eyes.   Follow up with your health care provider as directed.  SEEK MEDICAL CARE IF: Your symptoms do not improve after 1 week of treatment.  SEEK IMMEDIATE MEDICAL CARE IF:  You develop an increased fever or chills.   You have chest pain.   You have severe shortness of breath.  You have bloody sputum.   You develop dehydration.  You develop fainting.  You develop repeated vomiting.  You develop a severe headache. MAKE SURE YOU:   Understand these instructions.  Will watch your condition.  Will get help right away if you are not doing well or get worse. Document Released: 04/06/2004 Document Revised: 10/30/2012 Document Reviewed: 08/20/2012 [employer] Patient Information 2014 [employer].  

## 2013-08-18 IMAGING — RF DG CERVICAL SPINE 1V
1 series · 1 of 1 positions shown · non-contrast
Comparison: Cervical myelogram 10/02/2011

CLINICAL DATA: ACDF C3-4, C5-6.

DG C-ARM 1-60 MIN,DG CERVICAL SPINE - 1 VIEW

[Series 1: run · 1 of 1 slices shown]
[im 1/1]
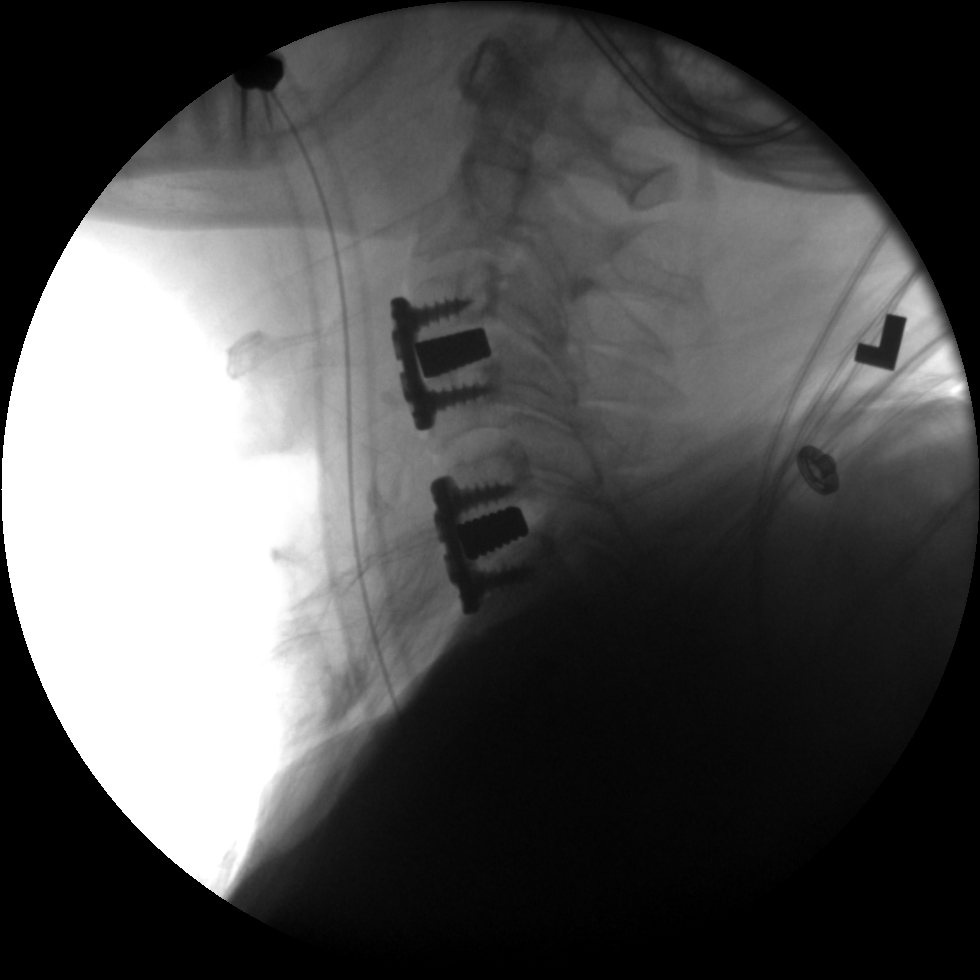

[1 of 1 positions shown; findings below may reference images not displayed]

FINDINGS: Changes of anterior fusion at C3-4 with separate fusion
anteriorly at C5-6.  Normal alignment.  No hardware or bony
complicating feature.
IMPRESSION: ACDF C3-4 and C5-6.

## 2013-08-18 IMAGING — CR DG CHEST 2V
2 series · 2 of 2 positions shown · non-contrast
Comparison: None.

CLINICAL DATA: Preoperative respiratory exam, cervical HNP

CHEST - 2 VIEW

[view not recorded (1 of 2)]
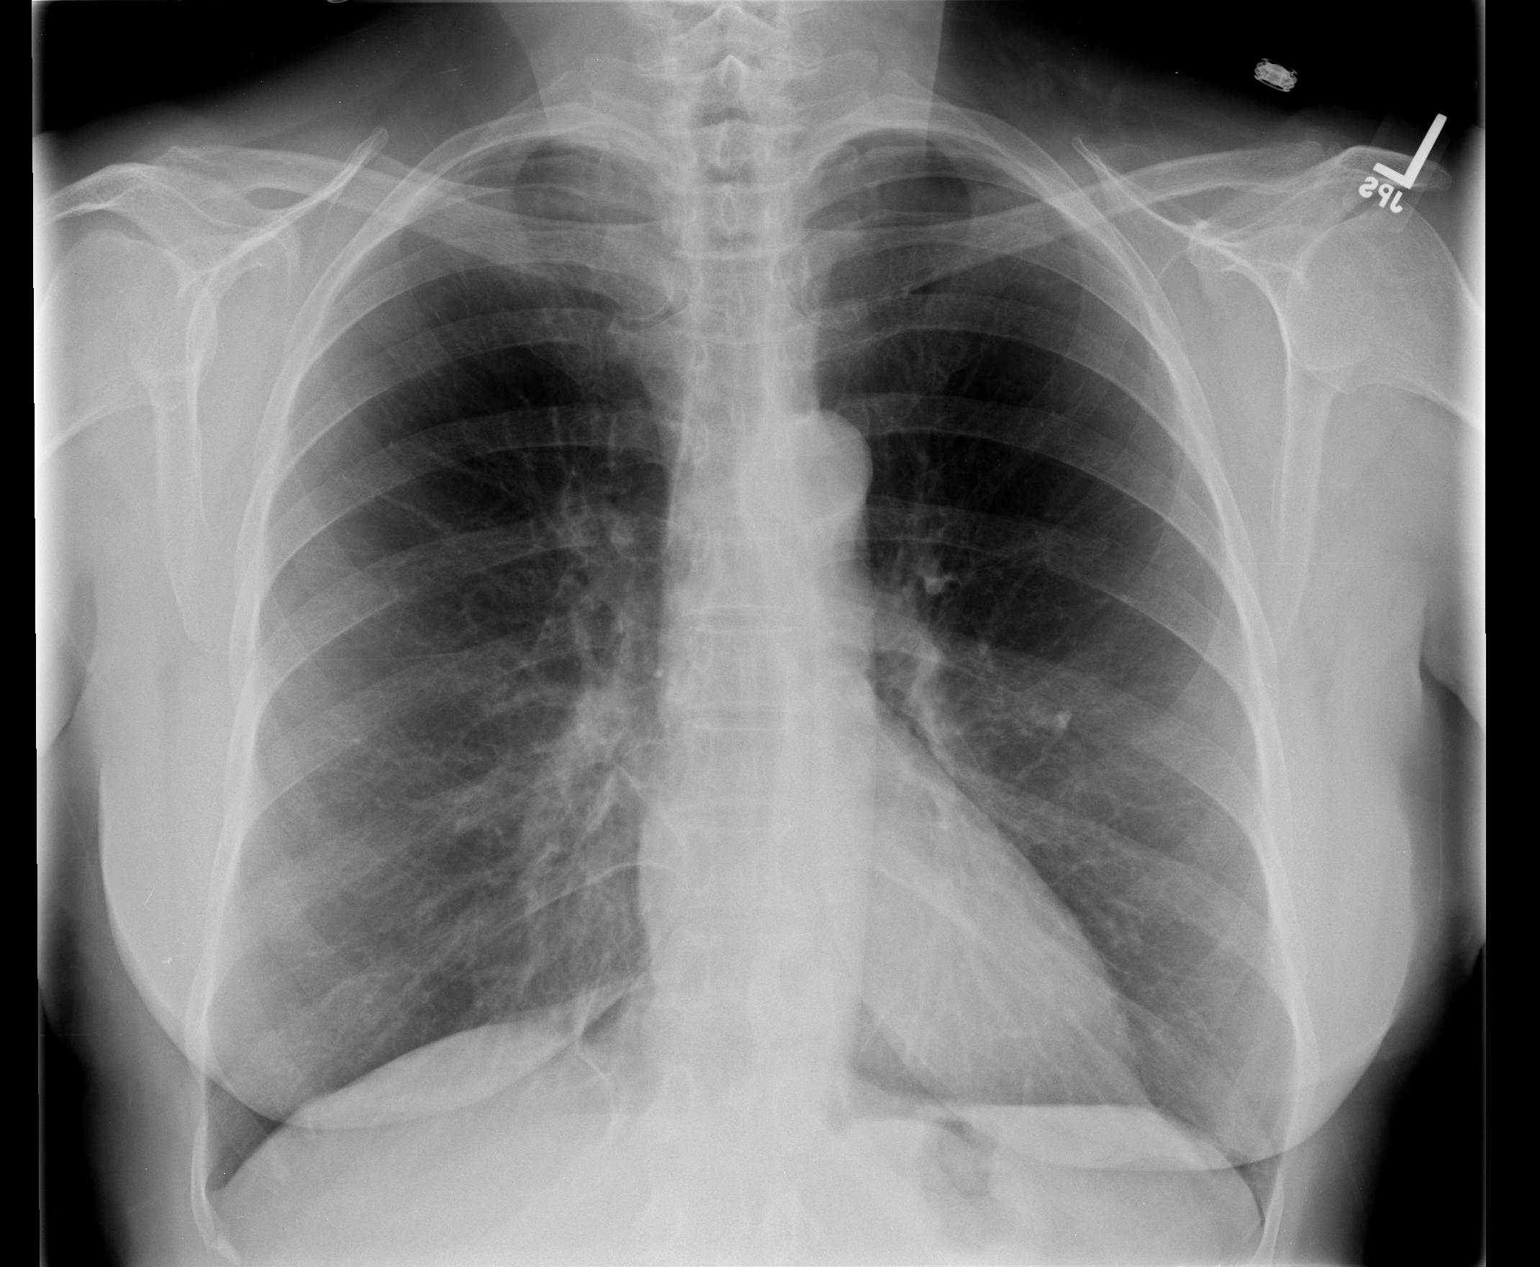

[view not recorded (2 of 2)]
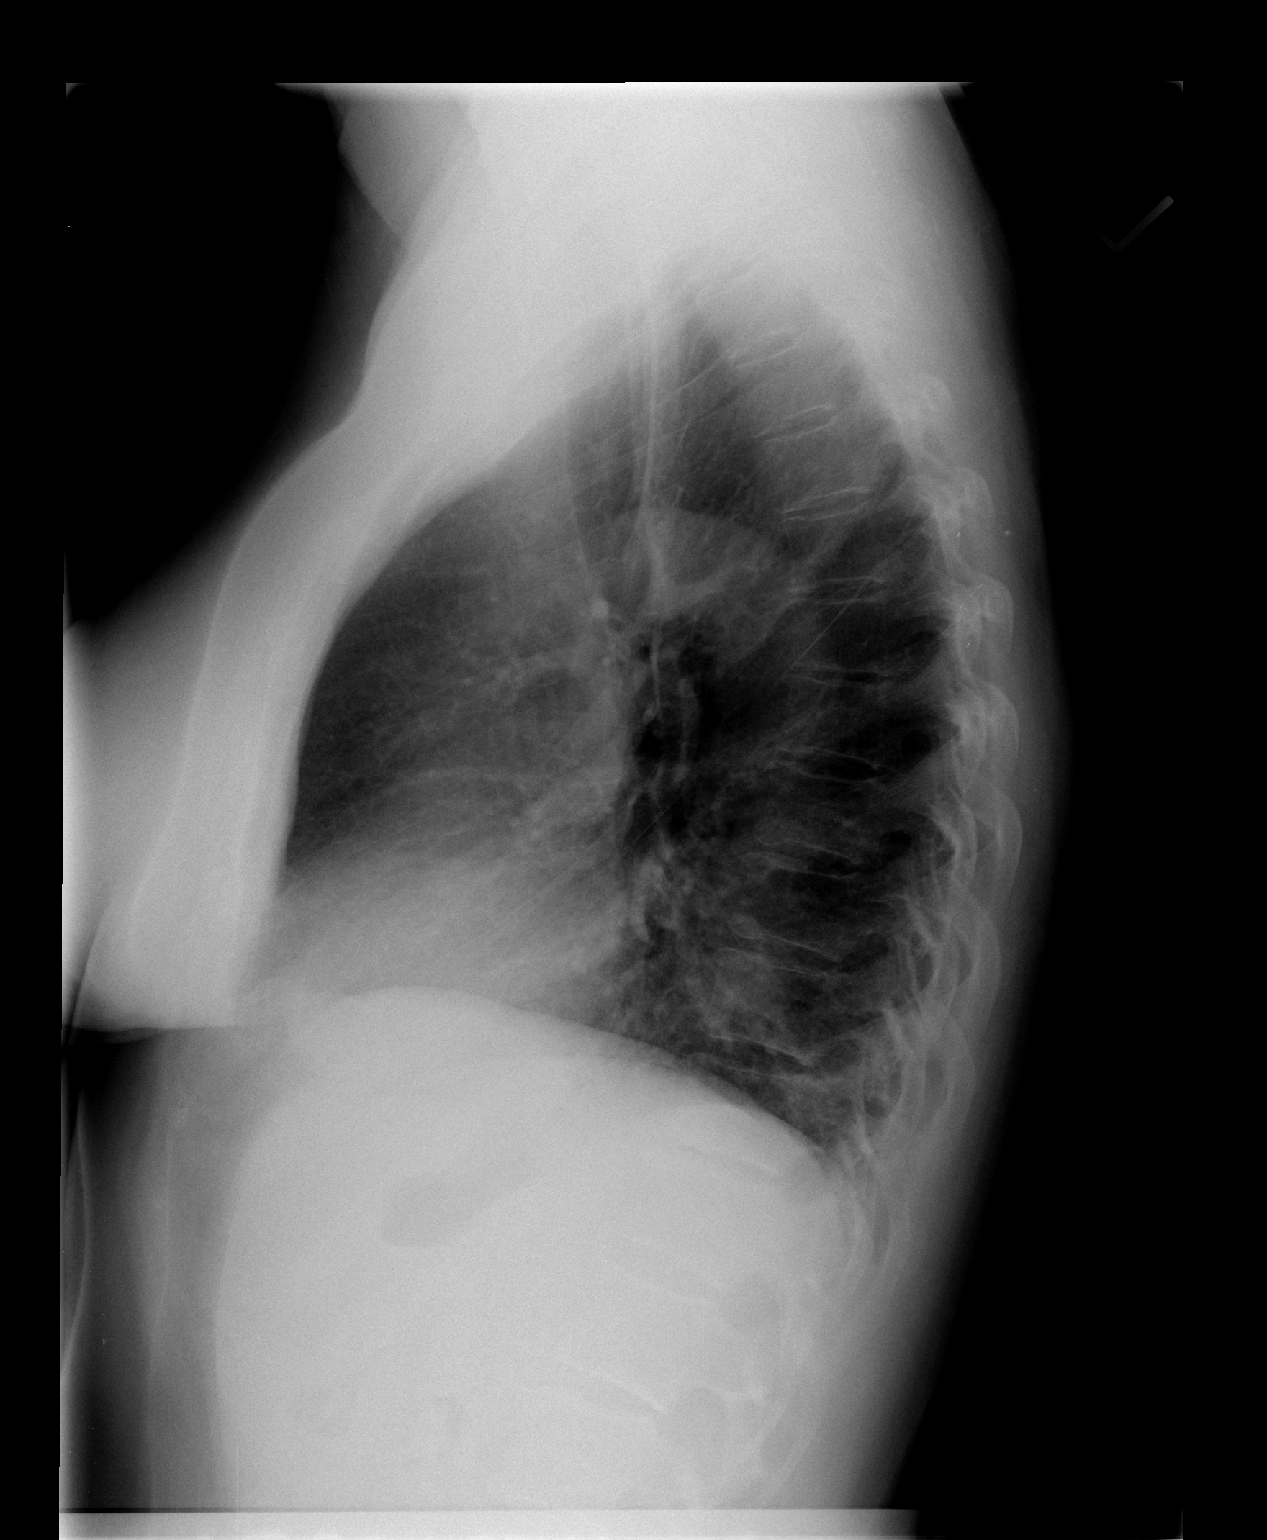

[2 of 2 positions shown; findings below may reference images not displayed]

FINDINGS: The cardiac silhouette, mediastinum, pulmonary
vasculature are within normal limits.  Both lungs are clear.
There is no acute bony abnormality.
IMPRESSION: There is no evidence of acute cardiac or pulmonary process.

## 2013-09-15 ENCOUNTER — Other Ambulatory Visit: Payer: Self-pay | Admitting: Internal Medicine

## 2013-09-19 LAB — HM MAMMOGRAPHY

## 2013-10-16 ENCOUNTER — Other Ambulatory Visit: Payer: Self-pay | Admitting: Internal Medicine

## 2013-12-05 ENCOUNTER — Ambulatory Visit (INDEPENDENT_AMBULATORY_CARE_PROVIDER_SITE_OTHER): Payer: BC Managed Care – PPO | Admitting: Family Medicine

## 2013-12-05 ENCOUNTER — Ambulatory Visit (INDEPENDENT_AMBULATORY_CARE_PROVIDER_SITE_OTHER): Payer: BC Managed Care – PPO

## 2013-12-05 VITALS — BP 136/64 | HR 74 | Temp 97.7°F | Resp 16 | Ht 65.0 in | Wt 140.0 lb

## 2013-12-05 DIAGNOSIS — M79609 Pain in unspecified limb: Secondary | ICD-10-CM

## 2013-12-05 DIAGNOSIS — S63602A Unspecified sprain of left thumb, initial encounter: Secondary | ICD-10-CM

## 2013-12-05 DIAGNOSIS — M79645 Pain in left finger(s): Secondary | ICD-10-CM

## 2013-12-05 DIAGNOSIS — S6390XA Sprain of unspecified part of unspecified wrist and hand, initial encounter: Secondary | ICD-10-CM

## 2013-12-05 NOTE — Patient Instructions (Addendum)
Wear the wrist splint  Take Aleve 2 pills twice daily with food  Return if improving in 2 weeks

## 2013-12-05 NOTE — Progress Notes (Signed)
Subjective: About a month ago the patient slipped from her chair and landed on left hand. She is persisted in having pain in the left thumb. Has not done anything for it. She is right-handed.  Objective: Tender left thumb at the MCP and PIP joints. She is tender in the anatomical snuff box. Wrist has fairly good range of motion and does not seem to be real tender.  Assessment: Left thumb and wrist pain  Plan: X-ray  [facility] reading (PRIMARY) by  Dr. Linna Darner normal thumb  Plan spica thumb wrist splint

## 2014-02-19 ENCOUNTER — Telehealth: Payer: Self-pay | Admitting: *Deleted

## 2014-02-19 NOTE — Telephone Encounter (Signed)
LMOM for pt to come in to get flu shot.

## 2014-03-04 ENCOUNTER — Other Ambulatory Visit: Payer: Self-pay | Admitting: Obstetrics and Gynecology

## 2014-03-05 LAB — CYTOLOGY - PAP

## 2014-08-26 ENCOUNTER — Telehealth: Payer: Self-pay | Admitting: Radiology

## 2014-08-26 DIAGNOSIS — M79646 Pain in unspecified finger(s): Secondary | ICD-10-CM

## 2014-08-26 DIAGNOSIS — M1990 Unspecified osteoarthritis, unspecified site: Secondary | ICD-10-CM

## 2014-09-03 ENCOUNTER — Encounter: Payer: Self-pay | Admitting: *Deleted

## 2014-10-05 ENCOUNTER — Encounter: Payer: Self-pay | Admitting: *Deleted

## 2014-10-07 ENCOUNTER — Encounter: Payer: Self-pay | Admitting: *Deleted

## 2015-03-14 HISTORY — PX: OTHER SURGICAL HISTORY: SHX169

## 2015-04-16 ENCOUNTER — Other Ambulatory Visit: Payer: Self-pay | Admitting: Obstetrics and Gynecology

## 2015-04-19 LAB — CYTOLOGY - PAP

## 2015-04-20 ENCOUNTER — Other Ambulatory Visit: Payer: Self-pay | Admitting: Obstetrics and Gynecology

## 2015-04-20 DIAGNOSIS — E2839 Other primary ovarian failure: Secondary | ICD-10-CM

## 2015-08-30 ENCOUNTER — Encounter: Payer: Self-pay | Admitting: Internal Medicine

## 2015-11-12 ENCOUNTER — Encounter: Payer: Self-pay | Admitting: Internal Medicine

## 2015-12-14 ENCOUNTER — Other Ambulatory Visit: Payer: Self-pay | Admitting: Orthopedic Surgery

## 2015-12-14 DIAGNOSIS — M25551 Pain in right hip: Secondary | ICD-10-CM

## 2015-12-23 ENCOUNTER — Other Ambulatory Visit: Payer: Self-pay

## 2015-12-28 ENCOUNTER — Encounter: Payer: Self-pay | Admitting: Internal Medicine

## 2015-12-28 ENCOUNTER — Ambulatory Visit (AMBULATORY_SURGERY_CENTER): Payer: Self-pay

## 2015-12-28 VITALS — Ht 65.0 in | Wt 150.4 lb

## 2015-12-28 DIAGNOSIS — Z86010 Personal history of colonic polyps: Secondary | ICD-10-CM

## 2015-12-28 DIAGNOSIS — Z800 Family history of malignant neoplasm of digestive organs: Secondary | ICD-10-CM

## 2015-12-28 MED ORDER — NA SULFATE-K SULFATE-MG SULF 17.5-3.13-1.6 GM/177ML PO SOLN: ORAL | 0 refills | Status: DC

## 2015-12-28 NOTE — Progress Notes (Signed)
Per pt, no allergies to soy or egg products.Pt not taking any weight loss meds or using  O2 at home.  Read consent form and instructions for prep to pt and answered all her questions!

## 2016-01-07 ENCOUNTER — Telehealth: Payer: Self-pay | Admitting: Internal Medicine

## 2016-01-07 NOTE — Telephone Encounter (Signed)
No

## 2016-01-11 ENCOUNTER — Encounter: Payer: BLUE CROSS/BLUE SHIELD | Admitting: Internal Medicine

## 2016-03-22 ENCOUNTER — Encounter (HOSPITAL_COMMUNITY): Payer: Self-pay

## 2016-03-22 ENCOUNTER — Emergency Department (HOSPITAL_COMMUNITY)
Admission: EM | Admit: 2016-03-22 | Discharge: 2016-03-22 | Disposition: A | Discharge status: Home / Self Care | Payer: BLUE CROSS/BLUE SHIELD | Attending: Emergency Medicine | Admitting: Emergency Medicine

## 2016-03-22 DIAGNOSIS — T7840XA Allergy, unspecified, initial encounter: Secondary | ICD-10-CM | POA: Diagnosis not present

## 2016-03-22 DIAGNOSIS — I10 Essential (primary) hypertension: Secondary | ICD-10-CM | POA: Insufficient documentation

## 2016-03-22 MED ORDER — FAMOTIDINE IN NACL 20-0.9 MG/50ML-% IV SOLN: 20.0000 mg | Freq: Once | INTRAVENOUS | Status: DC

## 2016-03-22 MED ORDER — PREDNISONE 20 MG PO TABS: 60.0000 mg | ORAL_TABLET | Freq: Once | ORAL | Status: DC

## 2016-03-22 MED ORDER — SODIUM CHLORIDE 0.9 % IV SOLN: Freq: Once | INTRAVENOUS | Status: DC

## 2016-03-22 MED ORDER — RANITIDINE HCL 150 MG/10ML PO SYRP
300.0000 mg | ORAL_SOLUTION | Freq: Once | ORAL | Status: AC
  Administered 2016-03-22: 300 mg via ORAL
  Filled 2016-03-22: qty 20

## 2016-03-22 MED ORDER — DIPHENHYDRAMINE HCL 25 MG PO CAPS
25.0000 mg | ORAL_CAPSULE | Freq: Once | ORAL | Status: AC
  Administered 2016-03-22: 25 mg via ORAL
  Filled 2016-03-22: qty 1

## 2016-03-22 NOTE — ED Triage Notes (Signed)
Pt states that she ate a crab cake about 6pm, when she went to bed about 9 she felt like she was having trouble breathing and her tongue was swelling, she took a benedryl about 30 minutes prior to arrival to the ED, pt states she feels like her tongue is still swollen but not as bad.

## 2016-03-22 NOTE — ED Provider Notes (Signed)
[facility] DEPT Provider Note   CSN: [id number] Arrival date & time: 03/22/16  0056 By signing my name below, I, Georgette Shell, attest that this documentation has been prepared under the direction and in the presence of Everlene Balls, MD. Electronically Signed: Georgette Shell, ED Scribe. 03/22/16. 1:35 AM.  History   Chief Complaint Chief Complaint  Patient presents with  . Allergic Reaction   HPI Comments: Crystal Stokes is a 76 y.o. female with h/o HTN, who presents to the Emergency Department complaining of tongue swelling onset ~9pm last night. Pt states she had mild difficulty breathing secondary to the swelling. Pt states she ate a crab cake around 6 pm and is concerned this may have caused an allergic reaction. She has taken a Benadryl 30 mins PTA with significant relief. Pt reports that her tongue swelling has greatly improved but is still slightly swollen at this time. Denies h/o similar symptoms. Pt further denies fever, chills, rash, nausea, vomiting, or any other associated symptoms.   The history is provided by the patient. No language interpreter was used.    Past Medical History:  Diagnosis Date  . Cataract    LEFT EYE REMOVED IN JAN 2012  . Diverticulitis   . Family history of colon cancer   . Hemorrhoids   . History of shingles     8 times per pt!  . Hypertension   . Legal blindness    since 76 years old  . Neuromuscular disorder ([place])    per pt, not aware of this dx  . Retinitis pigmentosa of both eyes    caused the blindness    Patient Active Problem List   Diagnosis Date Noted  . Cervical neck pain with evidence of disc disease 02/18/2013  . DIVERTICULITIS, COLON 07/13/2008  . ABDOMINAL PAIN, LEFT LOWER QUADRANT 07/13/2008  . HYPERTENSION NEC 07/10/2008    Past Surgical History:  Procedure Laterality Date  . ANTERIOR CERVICAL DECOMP/DISCECTOMY FUSION  10/12/2011   Procedure: ANTERIOR CERVICAL DECOMPRESSION/DISCECTOMY FUSION 2 LEVELS;  Surgeon: Faythe Ghee, MD;  Location: [place] [facility];  Service: Neurosurgery;  Laterality: N/A;  Cervical Three-Four Cervical Five-Six Anterior Cervical Decompression/Diskectomy,Fusion  . CATARACTS  JANUARY   LEFT EYE  . COLONOSCOPY    . dental implants    . EYE SURGERY     Cataract left  . HEMORRHOID SURGERY    . Retina implant    . SPINE SURGERY      OB History    No data available       Home Medications    Prior to Admission medications   Medication Sig Start Date End Date Taking? Authorizing Provider  Cyanocobalamin (B-12) 5000 MCG SUBL Place 1 tablet under the tongue daily.    Historical Provider, MD  cyclobenzaprine (FLEXERIL) 10 MG tablet Take 0.5-1 tablets (5-10 mg total) by mouth 2 (two) times daily as needed for muscle spasms. Patient not taking: Reported on 12/28/2015 03/25/13   Orma Flaming, MD  gabapentin (NEURONTIN) 300 MG capsule Take 1 capsule (300 mg total) by mouth 3 (three) times daily. PATIENT NEEDS OFFICE VISIT FOR ADDITIONAL REFILLS Patient taking differently: Take 300 mg by mouth daily. PATIENT NEEDS OFFICE VISIT FOR ADDITIONAL REFILLS    Orma Flaming, MD  lisinopril-hydrochlorothiazide (PRINZIDE,ZESTORETIC) 10-12.5 MG per tablet Take 1 tablet by mouth daily.    Historical Provider, MD  Na Sulfate-K Sulfate-Mg Sulf (SUPREP BOWEL PREP KIT) 17.5-3.13-1.6 GM/180ML SOLN Suprep as directed / no substitutions 12/28/15  Irene Shipper, MD  Vitamin A 15000 UNITS TABS Take 1 tablet by mouth daily.     Historical Provider, MD    Family History Family History  Problem Relation Age of Onset  . Colon cancer Mother     Social History Social History  Substance Use Topics  . Smoking status: Never Smoker  . Smokeless tobacco: Never Used  . Alcohol use 1.2 oz/week    2 Glasses of wine per week     Allergies   Prednisone; Pregabalin; and Valium [diazepam]   Review of Systems Review of Systems 10 Systems reviewed and all are negative for acute change except as noted in the  HPI. Physical Exam Updated Vital Signs BP 141/83 (BP Location: Right Arm)   Pulse 88   Resp 18   SpO2 100%   Physical Exam  Constitutional: She is oriented to person, place, and time. She appears well-developed and well-nourished. No distress.  HENT:  Head: Normocephalic and atraumatic.  Nose: Nose normal.  Mouth/Throat: Oropharynx is clear and moist. No oropharyngeal exudate or posterior oropharyngeal edema.  No swelling in the posterior oropharynx.  Eyes: Conjunctivae and EOM are normal. Pupils are equal, round, and reactive to light. No scleral icterus.  Neck: Normal range of motion. Neck supple. No JVD present. No tracheal deviation present. No thyromegaly present.  Cardiovascular: Normal rate, regular rhythm and normal heart sounds.  Exam reveals no gallop and no friction rub.   No murmur heard. Pulmonary/Chest: Effort normal and breath sounds normal. No respiratory distress. She has no wheezes. She exhibits no tenderness.  Abdominal: Soft. Bowel sounds are normal. She exhibits no distension and no mass. There is no tenderness. There is no rebound and no guarding.  Musculoskeletal: Normal range of motion. She exhibits no edema or tenderness.  Lymphadenopathy:    She has no cervical adenopathy.  Neurological: She is alert and oriented to person, place, and time. No cranial nerve deficit. She exhibits normal muscle tone.  Skin: Skin is warm and dry. No rash noted. No erythema. No pallor.  Nursing note and vitals reviewed.    ED Treatments / Results  DIAGNOSTIC STUDIES: Oxygen Saturation is 100% on RA, normal by my interpretation.    COORDINATION OF CARE: 1:34 AM Discussed treatment plan with pt at bedside which includes monitoring and pt agreed to plan.  Labs (all labs ordered are listed, but only abnormal results are displayed) Labs Reviewed - No data to display  EKG  EKG Interpretation None       Radiology No results found.  Procedures Procedures (including  critical care time)  Medications Ordered in ED Medications - No data to display   Initial Impression / Assessment and Plan / ED Course  I have reviewed the triage vital signs and the nursing notes.  Pertinent labs & imaging results that were available during my care of the patient were reviewed by me and considered in my medical decision making (see chart for details).  Clinical Course     Patient presents to the ED for swelling in her throat and possible allergic reaction.  Not anaphylaxis because she denies any other organ system involved.  She already feels better with benadryl, she took 90m.  Will give another 279mand ranitidine.  She is allergic for prednisone.    Upon repeat evaluation, there is still no swelling.  Advised for PCP fu within 3 days.  She demonstrates good understanding as does her husband.  VS remain within her normal limits  and she is safe for DC.  Final Clinical Impressions(s) / ED Diagnoses   Final diagnoses:  None    New Prescriptions New Prescriptions   No medications on file   I personally performed the services described in this documentation, which was scribed in my presence. The recorded information has been reviewed and is accurate.       Everlene Balls, MD 03/22/16 [phone]

## 2016-03-26 ENCOUNTER — Emergency Department (HOSPITAL_COMMUNITY): Payer: BLUE CROSS/BLUE SHIELD

## 2016-03-26 ENCOUNTER — Emergency Department (HOSPITAL_COMMUNITY)
Admission: EM | Admit: 2016-03-26 | Discharge: 2016-03-26 | Disposition: A | Discharge status: Home / Self Care | Payer: BLUE CROSS/BLUE SHIELD | Attending: Emergency Medicine | Admitting: Emergency Medicine

## 2016-03-26 ENCOUNTER — Encounter (HOSPITAL_COMMUNITY): Payer: Self-pay | Admitting: Emergency Medicine

## 2016-03-26 DIAGNOSIS — R509 Fever, unspecified: Secondary | ICD-10-CM | POA: Diagnosis present

## 2016-03-26 DIAGNOSIS — I10 Essential (primary) hypertension: Secondary | ICD-10-CM | POA: Insufficient documentation

## 2016-03-26 DIAGNOSIS — Z79899 Other long term (current) drug therapy: Secondary | ICD-10-CM | POA: Diagnosis not present

## 2016-03-26 LAB — CBC WITH DIFFERENTIAL/PLATELET
BASOS PCT: 0 %
Basophils Absolute: 0 10*3/uL (ref 0.0–0.1)
EOS ABS: 0.1 10*3/uL (ref 0.0–0.7)
Eosinophils Relative: 2 %
HCT: 35.4 % — ABNORMAL LOW (ref 36.0–46.0)
Hemoglobin: 12.2 g/dL (ref 12.0–15.0)
Lymphocytes Relative: 33 %
Lymphs Abs: 1.9 10*3/uL (ref 0.7–4.0)
MCH: 33 pg (ref 26.0–34.0)
MCHC: 34.5 g/dL (ref 30.0–36.0)
MCV: 95.7 fL (ref 78.0–100.0)
MONO ABS: 0.6 10*3/uL (ref 0.1–1.0)
MONOS PCT: 10 %
NEUTROS ABS: 3.3 10*3/uL (ref 1.7–7.7)
Neutrophils Relative %: 55 %
PLATELETS: 316 10*3/uL (ref 150–400)
RBC: 3.7 MIL/uL — ABNORMAL LOW (ref 3.87–5.11)
RDW: 13.2 % (ref 11.5–15.5)
WBC: 6 10*3/uL (ref 4.0–10.5)

## 2016-03-26 LAB — URINALYSIS, ROUTINE W REFLEX MICROSCOPIC
BILIRUBIN URINE: NEGATIVE
GLUCOSE, UA: NEGATIVE mg/dL
KETONES UR: NEGATIVE mg/dL
LEUKOCYTES UA: NEGATIVE
NITRITE: NEGATIVE
PH: 7 (ref 5.0–8.0)
Protein, ur: NEGATIVE mg/dL
Specific Gravity, Urine: 1.005 (ref 1.005–1.030)
WBC, UA: NONE SEEN WBC/hpf (ref 0–5)

## 2016-03-26 LAB — BASIC METABOLIC PANEL
ANION GAP: 9 (ref 5–15)
BUN: 13 mg/dL (ref 6–20)
CALCIUM: 9.4 mg/dL (ref 8.9–10.3)
CO2: 25 mmol/L (ref 22–32)
CREATININE: 0.85 mg/dL (ref 0.44–1.00)
Chloride: 101 mmol/L (ref 101–111)
Glucose, Bld: 102 mg/dL — ABNORMAL HIGH (ref 65–99)
Potassium: 3.2 mmol/L — ABNORMAL LOW (ref 3.5–5.1)
SODIUM: 135 mmol/L (ref 135–145)

## 2016-03-26 LAB — TSH: TSH: 2.953 u[IU]/mL (ref 0.350–4.500)

## 2016-03-26 LAB — MAGNESIUM: MAGNESIUM: 1.8 mg/dL (ref 1.7–2.4)

## 2016-03-26 LAB — T4, FREE: FREE T4: 1.02 ng/dL (ref 0.61–1.12)

## 2016-03-26 MED ORDER — SODIUM CHLORIDE 0.9 % IV BOLUS (SEPSIS)
1000.0000 mL | Freq: Once | INTRAVENOUS | Status: AC
  Administered 2016-03-26: 1000 mL via INTRAVENOUS

## 2016-03-26 NOTE — ED Provider Notes (Signed)
[facility] DEPT Provider Note   CSN: [id number] Arrival date & time: 03/26/16  0844     History   Chief Complaint Chief Complaint  Patient presents with  . Fever    HPI Crystal Stokes is a 76 y.o. female.  HPI  76 year old female who presents with low grade fever. She has history of HTN and legal blindness. Was in [place] at the end of December and having low grade fevers, weakness. Seen in ED in [place] and given IVF with reassuring blood work. Patient w/o unusual exposures in [place], and returned with her husband back to [place]. On the way back, she had a panic attack, which she has not had before.  Still having low grade temperatures 98-31F. Having heat and cold intolerance, thinning hair. Dry cough present, no sore throat, myalgias, congestion, vomiting, diarrhea, constipation, abdominal pain, chest pain, dyspnea. She was concerned about thyroid issues as it runs in her family.  Past Medical History:  Diagnosis Date  . Cataract    LEFT EYE REMOVED IN JAN 2012  . Diverticulitis   . Family history of colon cancer   . Hemorrhoids   . History of shingles     8 times per pt!  . Hypertension   . Legal blindness    since 76 years old  . Neuromuscular disorder ([place])    per pt, not aware of this dx  . Retinitis pigmentosa of both eyes    caused the blindness    Patient Active Problem List   Diagnosis Date Noted  . Cervical neck pain with evidence of disc disease 02/18/2013  . DIVERTICULITIS, COLON 07/13/2008  . ABDOMINAL PAIN, LEFT LOWER QUADRANT 07/13/2008  . HYPERTENSION NEC 07/10/2008    Past Surgical History:  Procedure Laterality Date  . ANTERIOR CERVICAL DECOMP/DISCECTOMY FUSION  10/12/2011   Procedure: ANTERIOR CERVICAL DECOMPRESSION/DISCECTOMY FUSION 2 LEVELS;  Surgeon: Faythe Ghee, MD;  Location: [facility];  Service: Neurosurgery;  Laterality: N/A;  Cervical Three-Four Cervical Five-Six Anterior Cervical Decompression/Diskectomy,Fusion  . CATARACTS   JANUARY   LEFT EYE  . COLONOSCOPY    . dental implants    . EYE SURGERY     Cataract left  . HEMORRHOID SURGERY    . Retina implant    . SPINE SURGERY      OB History    No data available       Home Medications    Prior to Admission medications   Medication Sig Start Date End Date Taking? Authorizing Provider  gabapentin (NEURONTIN) 300 MG capsule Take 1 capsule (300 mg total) by mouth 3 (three) times daily. PATIENT NEEDS OFFICE VISIT FOR ADDITIONAL REFILLS Patient taking differently: Take 300 mg by mouth daily. PATIENT NEEDS OFFICE VISIT FOR ADDITIONAL REFILLS   Yes Orma Flaming, MD  gabapentin (NEURONTIN) 300 MG capsule Take 300 mg by mouth at bedtime as needed for itching. 08/17/14  Yes Historical Provider, MD  lisinopril-hydrochlorothiazide (PRINZIDE,ZESTORETIC) 10-12.5 MG tablet Take 1 tablet by mouth daily.   Yes Historical Provider, MD  LORazepam (ATIVAN) 1 MG tablet Take 0.25-0.5 mg by mouth every 8 (eight) hours.   Yes Historical Provider, MD  LYSINE PO Take 300 mg by mouth daily.   Yes Historical Provider, MD  Vitamin A 15000 UNITS TABS Take 1 tablet by mouth daily.    Yes Historical Provider, MD  Cyanocobalamin (B-12) 5000 MCG SUBL Place 1 tablet under the tongue daily.    Historical Provider, MD  cyclobenzaprine (FLEXERIL)  10 MG tablet Take 0.5-1 tablets (5-10 mg total) by mouth 2 (two) times daily as needed for muscle spasms. Patient not taking: Reported on 03/26/2016 03/25/13   Orma Flaming, MD  Na Sulfate-K Sulfate-Mg Sulf [facility] BOWEL PREP KIT) 17.5-3.13-1.6 GM/180ML SOLN Suprep as directed / no substitutions Patient not taking: Reported on 03/26/2016 12/28/15   Irene Shipper, MD    Family History Family History  Problem Relation Age of Onset  . Colon cancer Mother     Social History Social History  Substance Use Topics  . Smoking status: Never Smoker  . Smokeless tobacco: Never Used  . Alcohol use 1.2 oz/week    2 Glasses of wine per week      Allergies   Prednisone; Pregabalin; and Valium [diazepam]   Review of Systems Review of Systems 10/14 systems reviewed and are negative other than those stated in the HPI   Physical Exam Updated Vital Signs BP 135/85   Pulse 94   Temp 98.5 F (36.9 C) (Oral)   Resp 16   SpO2 100%   Physical Exam Physical Exam  Nursing note and vitals reviewed. Constitutional: Well developed, well nourished, non-toxic, and in no acute distress Head: Normocephalic and atraumatic.  Mouth/Throat: Oropharynx is clear and moist.  Neck: Normal range of motion. Neck supple.  Cardiovascular: Normal rate and regular rhythm.   Pulmonary/Chest: Effort normal and breath sounds normal.  Abdominal: Soft. There is no tenderness. There is no rebound and no guarding.  Musculoskeletal: Normal range of motion.  Neurological: Alert, no facial droop, fluent speech, moves all extremities symmetrically Skin: Skin is warm and dry.  Psychiatric: Cooperative   ED Treatments / Results  Labs (all labs ordered are listed, but only abnormal results are displayed) Labs Reviewed  URINALYSIS, ROUTINE W REFLEX MICROSCOPIC - Abnormal; Notable for the following:       Result Value   Color, Urine STRAW (*)    Hgb urine dipstick MODERATE (*)    Bacteria, UA RARE (*)    Squamous Epithelial / LPF 0-5 (*)    All other components within normal limits  CBC WITH DIFFERENTIAL/PLATELET - Abnormal; Notable for the following:    RBC 3.70 (*)    HCT 35.4 (*)    All other components within normal limits  BASIC METABOLIC PANEL - Abnormal; Notable for the following:    Potassium 3.2 (*)    Glucose, Bld 102 (*)    All other components within normal limits  MAGNESIUM  TSH  T4, FREE    EKG  EKG Interpretation None       Radiology Dg Chest 2 View  Result Date: 03/26/2016 CLINICAL DATA:  Patient c/o intermittent fever and chills x several days; hx HTN EXAM: CHEST  2 VIEW COMPARISON:  10/12/2011 FINDINGS: Lower  cervical fusion. Bandlike density in the lingula favoring atelectasis or mild scarring. Cardiac and mediastinal margins appear normal. No pleural effusion. IMPRESSION: 1. Linear subsegmental atelectasis or scarring in the lingula. Otherwise the lungs appear clear. Electronically Signed   By: Van Clines M.D.   On: 03/26/2016 10:27    Procedures Procedures (including critical care time)  Medications Ordered in ED Medications  sodium chloride 0.9 % bolus 1,000 mL (1,000 mLs Intravenous New Bag/Given 03/26/16 0943)     Initial Impression / Assessment and Plan / ED Course  I have reviewed the triage vital signs and the nursing notes.  Pertinent labs & imaging results that were available during my care of the  patient were reviewed by me and considered in my medical decision making (see chart for details).  Clinical Course     Presenting with low-grade temperature since 1-2 weeks. Her temperatures at home typically running 98-99 Fahrenheit, which is not consistent with fever, but higher than her baseline. She is well-appearing in no acute distress with normal vital signs. Exam overall non-focal. No evidence of infection on workup including a UA and chest x-ray. Blood work reassuring. Discussed diet supplementation for potassium of 3.2. Patient has follow-up appointment with outpatient physician tomorrow for ongoing thyroid studies and further workup.  The patient appears reasonably screened and/or stabilized for discharge and I doubt any other medical condition or other [facility] requiring further screening, evaluation, or treatment in the ED at this time prior to discharge.  Strict return and follow-up instructions reviewed. She expressed understanding of all discharge instructions and felt comfortable with the plan of care.   Final Clinical Impressions(s) / ED Diagnoses   Final diagnoses:  Low grade fever    New Prescriptions New Prescriptions   No medications on file     Forde Dandy, MD 03/26/16 1106

## 2016-03-26 NOTE — ED Triage Notes (Signed)
Pt c/o dehydration, states she has had "fevers" up to 99 degrees F, panic attacks, anxiety, poor appetitie, hair thinning onset 03/04/16. States her normal temperature is about 96 degrees F.  Pt given IV fluids on 12/29 in hospital. Pt concerned about thyroid problem due to several family members having thyroid disease.

## 2016-03-26 NOTE — Discharge Instructions (Signed)
Please continue to follow-up with your primary care doctor regarding ongoing work-up.  Please return without fail for worsening symptoms, including persistent fever, confusion, or any other symptoms concerning to you.

## 2016-04-24 ENCOUNTER — Encounter: Payer: Self-pay | Admitting: Internal Medicine

## 2016-04-24 ENCOUNTER — Other Ambulatory Visit: Payer: Self-pay | Admitting: Obstetrics and Gynecology

## 2016-04-25 LAB — CYTOLOGY - PAP

## 2016-06-14 ENCOUNTER — Ambulatory Visit (AMBULATORY_SURGERY_CENTER): Payer: Self-pay

## 2016-06-14 VITALS — Ht 65.0 in | Wt 144.4 lb

## 2016-06-14 DIAGNOSIS — Z86010 Personal history of colonic polyps: Secondary | ICD-10-CM

## 2016-06-14 NOTE — Progress Notes (Signed)
Denies allergies to eggs or soy products. Denies complication of anesthesia or sedation. Denies use of weight loss medication. Denies use of O2.   Emmi instructions given for colonoscopy.   Patient is blind and declined video.   Reviewed Prep instructions with patient. Crystal Stokes assured me that someone would help her read the instructions when it is time to do her prep. Also patient told me that she already had Suprep because she was scheduled for a colonoscopy a few months ago but had to cancel. Suprep was not ordered at this visit.

## 2016-06-15 ENCOUNTER — Encounter: Payer: Self-pay | Admitting: Internal Medicine

## 2016-06-29 ENCOUNTER — Encounter: Payer: Self-pay | Admitting: Internal Medicine

## 2016-06-29 ENCOUNTER — Ambulatory Visit (AMBULATORY_SURGERY_CENTER): Payer: BLUE CROSS/BLUE SHIELD | Admitting: Internal Medicine

## 2016-06-29 VITALS — BP 130/82 | HR 78 | Temp 98.0°F | Resp 15 | Ht 65.0 in | Wt 144.0 lb

## 2016-06-29 DIAGNOSIS — Z800 Family history of malignant neoplasm of digestive organs: Secondary | ICD-10-CM

## 2016-06-29 DIAGNOSIS — Z1212 Encounter for screening for malignant neoplasm of rectum: Secondary | ICD-10-CM | POA: Diagnosis not present

## 2016-06-29 DIAGNOSIS — Z1211 Encounter for screening for malignant neoplasm of colon: Secondary | ICD-10-CM | POA: Diagnosis not present

## 2016-06-29 DIAGNOSIS — Z86010 Personal history of colonic polyps: Secondary | ICD-10-CM

## 2016-06-29 MED ORDER — SODIUM CHLORIDE 0.9 % IV SOLN: 500.0000 mL | INTRAVENOUS | Status: DC

## 2016-06-29 NOTE — Progress Notes (Signed)
Report to PACU, RN, vss, BBS= Clear.  

## 2016-06-29 NOTE — Op Note (Signed)
[facility] Patient Name: Crystal Stokes Procedure Date: 06/29/2016 11:42 AM MRN: [id number] Endoscopist: Docia Chuck. Henrene Pastor , MD Age: 76 Referring MD:  Date of Birth: [birth year] Gender: Female Account #: [id number] Procedure:                Colonoscopy Indications:              Screening in patient at increased risk: Colorectal                            cancer in mother 37 or older. Previous examinations                            2000, 2006(supplemented with barium enema), and                            2012 negative for neoplasia (small hyperplastic                            polyp only). Medicines:                Monitored Anesthesia Care Procedure:                Pre-Anesthesia Assessment:                           - Prior to the procedure, a History and Physical                            was performed, and patient medications and                            allergies were reviewed. The patient's tolerance of                            previous anesthesia was also reviewed. The risks                            and benefits of the procedure and the sedation                            options and risks were discussed with the patient.                            All questions were answered, and informed consent                            was obtained. Prior Anticoagulants: The patient has                            taken no previous anticoagulant or antiplatelet                            agents. ASA Grade Assessment: II - A patient with  mild systemic disease. After reviewing the risks                            and benefits, the patient was deemed in                            satisfactory condition to undergo the procedure.                           After obtaining informed consent, the colonoscope                            was passed under direct vision. Throughout the                            procedure, the patient's blood pressure, pulse, and                             oxygen saturations were monitored continuously. The                            Model CF-HQ190L [id number]) scope was introduced                            through the anus and advanced to the the cecum,                            identified by appendiceal orifice and ileocecal                            valve. The ileocecal valve, appendiceal orifice,                            and rectum were photographed. The quality of the                            bowel preparation was excellent. The colonoscopy                            was performed without difficulty. The patient                            tolerated the procedure well. The bowel preparation                            used was SUPREP. Scope In: 11:55:00 AM Scope Out: 12:11:46 PM Scope Withdrawal Time: 0 hours 10 minutes 10 seconds  Total Procedure Duration: 0 hours 16 minutes 46 seconds  Findings:                 A diffuse area of moderate melanosis was found in                            the entire colon.  A few small-mouthed diverticula were found in the                            sigmoid colon.                           Internal hemorrhoids were found during retroflexion.                           The exam was otherwise without abnormality on                            direct and retroflexion views. No polyps or other                            abnormalities. Complications:            No immediate complications. Estimated blood loss:                            None. Estimated Blood Loss:     Estimated blood loss: none. Impression:               - Melanosis in the colon.                           - Diverticulosis in the sigmoid colon.                           - Internal hemorrhoids.                           - The examination was otherwise normal on direct                            and retroflexion views.                           - No specimens  collected. Recommendation:           - Repeat colonoscopy is not recommended for                            surveillance given your history,current age and                            favorable findings.                           - Patient has a contact number available for                            emergencies. The signs and symptoms of potential                            delayed complications were discussed with the  patient. Return to normal activities tomorrow.                            Written discharge instructions were provided to the                            patient.                           - Resume previous diet.                           - Continue present medications. Docia Chuck. Henrene Pastor, MD 06/29/2016 12:23:59 PM This report has been signed electronically.

## 2016-06-29 NOTE — Patient Instructions (Signed)
YOU HAD AN ENDOSCOPIC PROCEDURE TODAY AT [facility]:   Refer to the procedure report that was given to you for any specific questions about what was found during the examination.  If the procedure report does not answer your questions, please call your gastroenterologist to clarify.  If you requested that your care partner not be given the details of your procedure findings, then the procedure report has been included in a sealed envelope for you to review at your convenience later.  YOU SHOULD EXPECT: Some feelings of bloating in the abdomen. Passage of more gas than usual.  Walking can help get rid of the air that was put into your GI tract during the procedure and reduce the bloating. If you had a lower endoscopy (such as a colonoscopy or flexible sigmoidoscopy) you may notice spotting of blood in your stool or on the toilet paper. If you underwent a bowel prep for your procedure, you may not have a normal bowel movement for a few days.  Please Note:  You might notice some irritation and congestion in your nose or some drainage.  This is from the oxygen used during your procedure.  There is no need for concern and it should clear up in a day or so.  SYMPTOMS TO REPORT IMMEDIATELY:   Following lower endoscopy (colonoscopy or flexible sigmoidoscopy):  Excessive amounts of blood in the stool  Significant tenderness or worsening of abdominal pains  Swelling of the abdomen that is new, acute  Fever of 100F or higher   For urgent or emergent issues, a gastroenterologist can be reached at any hour by calling [phone].   DIET:  We do recommend a small meal at first, but then you may proceed to your regular diet.  Drink plenty of fluids but you should avoid alcoholic beverages for 24 hours. Try to eat a high fiber diet,  and drink plenty of water.  ACTIVITY:  You should plan to take it easy for the rest of today and you should NOT DRIVE or use heavy machinery until tomorrow  (because of the sedation medicines used during the test).    FOLLOW UP: Our staff will call the number listed on your records the next business day following your procedure to check on you and address any questions or concerns that you may have regarding the information given to you following your procedure. If we do not reach you, we will leave a message.  However, if you are feeling well and you are not experiencing any problems, there is no need to return our call.  We will assume that you have returned to your regular daily activities without incident.  If any biopsies were taken you will be contacted by phone or by letter within the next 1-3 weeks.  Please call us at [phone] if you have not heard about the biopsies in 3 weeks.    SIGNATURES/CONFIDENTIALITY: You and/or your care partner have signed paperwork which will be entered into your electronic medical record.  These signatures attest to the fact that that the information above on your After Visit Summary has been reviewed and is understood.  Full responsibility of the confidentiality of this discharge information lies with you and/or your care-partner.  Read all of the handouts given to you by your recovery room nurse.  Thank-you for choosing [facility] for your healthcare needs today.

## 2016-06-30 ENCOUNTER — Telehealth: Payer: Self-pay

## 2016-06-30 NOTE — Telephone Encounter (Signed)
  Follow up Call-  Call back number 06/29/2016  Post procedure Call Back phone  # [phone], [phone]  Permission to leave phone message Yes  Some recent data might be hidden     Patient questions:  Do you have a fever, pain , or abdominal swelling? No. Pain Score  0 *  Have you tolerated food without any problems? Yes.    Have you been able to return to your normal activities? Yes.    Do you have any questions about your discharge instructions: Diet   No. Medications  No. Follow up visit  No.  Do you have questions or concerns about your Care? No.  Actions: * If pain score is 4 or above: No action needed, pain <4.

## 2017-01-30 ENCOUNTER — Encounter (HOSPITAL_BASED_OUTPATIENT_CLINIC_OR_DEPARTMENT_OTHER): Payer: Self-pay | Admitting: *Deleted

## 2017-01-30 ENCOUNTER — Emergency Department (HOSPITAL_BASED_OUTPATIENT_CLINIC_OR_DEPARTMENT_OTHER)
Admission: EM | Admit: 2017-01-30 | Discharge: 2017-01-30 | Disposition: A | Discharge status: Home / Self Care | Payer: BLUE CROSS/BLUE SHIELD | Source: Home / Self Care

## 2017-01-30 ENCOUNTER — Emergency Department (HOSPITAL_COMMUNITY)
Admission: EM | Admit: 2017-01-30 | Discharge: 2017-01-31 | Disposition: A | Discharge status: Home / Self Care | Payer: BLUE CROSS/BLUE SHIELD | Attending: Emergency Medicine | Admitting: Emergency Medicine

## 2017-01-30 ENCOUNTER — Other Ambulatory Visit: Payer: Self-pay

## 2017-01-30 ENCOUNTER — Encounter (HOSPITAL_COMMUNITY): Payer: Self-pay | Admitting: Emergency Medicine

## 2017-01-30 DIAGNOSIS — I10 Essential (primary) hypertension: Secondary | ICD-10-CM | POA: Insufficient documentation

## 2017-01-30 DIAGNOSIS — M549 Dorsalgia, unspecified: Secondary | ICD-10-CM | POA: Insufficient documentation

## 2017-01-30 DIAGNOSIS — Z5321 Procedure and treatment not carried out due to patient leaving prior to being seen by health care provider: Secondary | ICD-10-CM | POA: Insufficient documentation

## 2017-01-30 DIAGNOSIS — Z79899 Other long term (current) drug therapy: Secondary | ICD-10-CM | POA: Insufficient documentation

## 2017-01-30 DIAGNOSIS — M62830 Muscle spasm of back: Secondary | ICD-10-CM | POA: Insufficient documentation

## 2017-01-30 MED ORDER — OXYCODONE-ACETAMINOPHEN 5-325 MG PO TABS
1.0000 | ORAL_TABLET | Freq: Once | ORAL | Status: AC
  Administered 2017-01-30: 1 via ORAL
  Filled 2017-01-30: qty 1

## 2017-01-30 MED ORDER — KETOROLAC TROMETHAMINE 30 MG/ML IJ SOLN
30.0000 mg | Freq: Once | INTRAMUSCULAR | Status: AC
  Administered 2017-01-30: 30 mg via INTRAMUSCULAR
  Filled 2017-01-30: qty 1

## 2017-01-30 MED ORDER — OXYCODONE-ACETAMINOPHEN 5-325 MG PO TABS: 1.0000 | ORAL_TABLET | ORAL | 0 refills | Status: DC | PRN

## 2017-01-30 NOTE — ED Triage Notes (Addendum)
Patient c/o back pain since yesterday. Hx shingles. Seen at [facility] for same today. Ambulatory. Patient is blind.

## 2017-01-30 NOTE — ED Provider Notes (Signed)
[facility] DEPT Provider Note   CSN: [id number] Arrival date & time: 01/30/17  2046     History   Chief Complaint Chief Complaint  Patient presents with  . Back Pain    HPI Crystal Stokes is a 76 y.o. female.  She is here for evaluation of right-sided back pain, from her shoulder to her waist area, spontaneously starting today.  The pain is so bad that it makes her have a "spasm."  She has taken for the pain, ibuprofen, gabapentin, and Valtrex; without relief.  The pain feels identical to prior episodes of shingles that she has had at least 10 times by her report.  She states that she last had a rash when having shingles, about 3 months ago.  She typically takes Valtrex or acyclovir daily, to avoid outbreaks.  She has not seen a neurologist for postherpetic neuralgia.  She has had multiple other medical problems evaluated by her PCP with multiple testing, all nonrevealing by her report.  She denies fever, chills, hematuria, dysuria, abdominal pain or dizziness.  There are no other modifying factors.  HPI  Past Medical History:  Diagnosis Date  . Cataract    LEFT EYE REMOVED IN JAN 2012  . Diverticulitis   . Family history of colon cancer   . Hemorrhoids   . History of shingles     8 times per pt!  . Hyperlipidemia   . Hypertension   . Legal blindness    since 76 years old  . Neuromuscular disorder ([place])    per pt, not aware of this dx  . Retinitis pigmentosa of both eyes    caused the blindness    Patient Active Problem List   Diagnosis Date Noted  . Cervical neck pain with evidence of disc disease 02/18/2013  . DIVERTICULITIS, COLON 07/13/2008  . ABDOMINAL PAIN, LEFT LOWER QUADRANT 07/13/2008  . HYPERTENSION NEC 07/10/2008    Past Surgical History:  Procedure Laterality Date  . ANTERIOR CERVICAL DECOMP/DISCECTOMY FUSION  10/12/2011   Procedure: ANTERIOR CERVICAL DECOMPRESSION/DISCECTOMY FUSION 2 LEVELS;  Surgeon: Faythe Ghee,  MD;  Location: [facility];  Service: Neurosurgery;  Laterality: N/A;  Cervical Three-Four Cervical Five-Six Anterior Cervical Decompression/Diskectomy,Fusion  . CATARACTS  JANUARY   LEFT EYE  . COLONOSCOPY    . dental implants    . EYE SURGERY     Cataract left  . HEMORRHOID SURGERY    . Retina implant    . SPINE SURGERY      OB History    No data available       Home Medications    Prior to Admission medications   Medication Sig Start Date End Date Taking? Authorizing Provider  Cyanocobalamin (B-12) 5000 MCG SUBL Place 1 tablet under the tongue daily.    [provider]  gabapentin (NEURONTIN) 300 MG capsule Take 300 mg by mouth at bedtime as needed for itching. 08/17/14   [provider]  lisinopril-hydrochlorothiazide (PRINZIDE,ZESTORETIC) 10-12.5 MG tablet Take 1 tablet by mouth daily.    [provider]  LORazepam (ATIVAN) 1 MG tablet Take 0.25-0.5 mg by mouth every 8 (eight) hours.    [provider]  LYSINE PO Take 300 mg by mouth daily.    [provider]  Vitamin A 15000 UNITS TABS Take 1 tablet by mouth daily.     [provider]    Family History Family History  Problem Relation Age of Onset  . Colon cancer Mother   .  Esophageal cancer Neg Hx   . Rectal cancer Neg Hx   . Stomach cancer Neg Hx     Social History Social History   Tobacco Use  . Smoking status: Never Smoker  . Smokeless tobacco: Never Used  Substance Use Topics  . Alcohol use: Yes    Alcohol/week: 1.2 oz    Types: 2 Glasses of wine per week  . Drug use: No     Allergies   Prednisone; Pregabalin; and Valium [diazepam]   Review of Systems Review of Systems  All other systems reviewed and are negative.    Physical Exam Updated Vital Signs BP 133/81 (BP Location: Right Arm)   Pulse 88   Temp 97.9 F (36.6 C) (Oral)   Resp 18   SpO2 98%   Physical Exam  Constitutional: She is oriented to person, place, and time. She  appears well-developed and well-nourished. No distress.  HENT:  Head: Normocephalic and atraumatic.  Eyes: Conjunctivae and EOM are normal. Pupils are equal, round, and reactive to light.  Neck: Normal range of motion and phonation normal. Neck supple.  Cardiovascular: Normal rate and regular rhythm.  Pulmonary/Chest: Effort normal and breath sounds normal. She exhibits no tenderness.  Musculoskeletal: Normal range of motion.  Mild tenderness, right posterior thorax, diffusely without areas of deformity, spasm or evident scoliosis.  Neurological: She is alert and oriented to person, place, and time. She exhibits normal muscle tone.  Skin: Skin is warm and dry.  No vesicular rash of thorax.  Scattered actinic keratoses of the back.  Psychiatric: She has a normal mood and affect. Her behavior is normal. Judgment and thought content normal.  Nursing note and vitals reviewed.    ED Treatments / Results  Labs (all labs ordered are listed, but only abnormal results are displayed) Labs Reviewed - No data to display  EKG  EKG Interpretation None       Radiology No results found.  Procedures Procedures (including critical care time)  Medications Ordered in ED Medications  oxyCODONE-acetaminophen (PERCOCET/ROXICET) 5-325 MG per tablet 1 tablet (not administered)     Initial Impression / Assessment and Plan / ED Course  I have reviewed the triage vital signs and the nursing notes.  Pertinent labs & imaging results that were available during my care of the patient were reviewed by me and considered in my medical decision making (see chart for details).      Patient Vitals for the past 24 hrs:  BP Temp Temp src Pulse Resp SpO2  01/30/17 2103 133/81 97.9 F (36.6 C) Oral 88 18 98 %    10:41 PM Reevaluation with update and discussion. After initial assessment and treatment, an updated evaluation reveals no change in clinical status.  Findings discussed with the patient, and  her son, all questions were answered. Daleen Bo      Final Clinical Impressions(s) / ED Diagnoses   Final diagnoses:  Back pain, unspecified back location, unspecified back pain laterality, unspecified chronicity   Nonspecific back pain, recurrent, with history of recurrent shingles.  Possible postherpetic neuralgia however the broad distribution of the discomfort does not make this seem likely.  Doubt spinal myelopathy, fracture or serious bacterial infection.  Nursing Notes Reviewed/ Care Coordinated Applicable Imaging Reviewed Interpretation of Laboratory Data incorporated into ED treatment  The patient appears reasonably screened and/or stabilized for discharge and I doubt any other medical condition or other [facility] requiring further screening, evaluation, or treatment in the ED at this time prior to  discharge.  Plan: Home Medications-continue usual medications; Home Treatments-rest, fluids; return here if the recommended treatment, does not improve the symptoms; Recommended follow up-PCP, as needed.  Consider referral to neurology.   ED Discharge Orders    None       Daleen Bo, MD 01/30/17 2245

## 2017-01-30 NOTE — Discharge Instructions (Signed)
There is no clear cause for your discomfort.  It is important to follow-up with your primary care doctor for further evaluation and treatment.  Ask your PCP for referral to a neurologist regarding the recurrent pain, which makes you feel like you have a shingles outbreak.

## 2017-01-30 NOTE — ED Triage Notes (Signed)
Back pain since yesterday. Spasms. States she thinks it is shingles because she has had the same pain and shingles x 9. No rash.

## 2017-01-31 ENCOUNTER — Encounter: Payer: Self-pay | Admitting: Neurology

## 2017-01-31 ENCOUNTER — Ambulatory Visit: Payer: BLUE CROSS/BLUE SHIELD | Admitting: Neurology

## 2017-01-31 ENCOUNTER — Other Ambulatory Visit: Payer: Self-pay

## 2017-01-31 VITALS — BP 118/73 | HR 74 | Ht 65.0 in | Wt 151.5 lb

## 2017-01-31 DIAGNOSIS — E538 Deficiency of other specified B group vitamins: Secondary | ICD-10-CM

## 2017-01-31 DIAGNOSIS — M5410 Radiculopathy, site unspecified: Secondary | ICD-10-CM | POA: Diagnosis not present

## 2017-01-31 MED ORDER — CARBAMAZEPINE 100 MG PO CHEW: 100.0000 mg | CHEWABLE_TABLET | Freq: Two times a day (BID) | ORAL | 1 refills | Status: DC

## 2017-01-31 NOTE — Progress Notes (Signed)
Reason for visit: Right flank pain  Referring physician: Dr. Corinda Gubler is a 76 y.o. female  History of present illness:  Ms. Friedhoff is a 76 year old right-handed white female with a history of a shingles outbreak that apparently occurred on the left side 8 or 9 years ago affecting the upper buttocks area on the left, the left mid back, and left shoulder and arm.  She claims that the outbreak occurred all at one time.  The patient has had multiple events of recurring pain that have been more frequent since in February 2018.  The patient is having events affecting the right side from the upper buttocks to the shoulder blade area on the right that occur all at once, unassociated with a skin rash.  The patient is on suppressive therapy with Valtrex, recently on acyclovir capsules taking them daily, increasing the dose during episodes of pain.  The patient is having pain problems every 6 weeks or so, the episodes may last anywhere from 2-4 days.  The patient has a burning pain, she claimed that there is muscle spasms associated with it, she may have sharp shooting pains as well.  The patient denies any numbness or weakness of the extremities, she feels normal between the attacks of pain.  The patient denies any change in balance, she denies any problems controlling the bowels of the bladder.  She denies any significant problems with headaches.  Because of the episodic discomfort, she comes to the office today for an evaluation.  She had cervical spine surgery done about 4 years ago.  Past Medical History:  Diagnosis Date  . Cataract    LEFT EYE REMOVED IN JAN 2012  . Diverticulitis   . Family history of colon cancer   . Hemorrhoids   . History of shingles     8 times per pt!  . Hyperlipidemia   . Hypertension   . Legal blindness    since 76 years old  . Neuromuscular disorder ([place])    per pt, not aware of this dx  . Retinitis pigmentosa of both eyes    caused the blindness     Past Surgical History:  Procedure Laterality Date  . ANTERIOR CERVICAL DECOMP/DISCECTOMY FUSION  10/12/2011   Procedure: ANTERIOR CERVICAL DECOMPRESSION/DISCECTOMY FUSION 2 LEVELS;  Surgeon: Faythe Ghee, MD;  Location: [facility];  Service: Neurosurgery;  Laterality: N/A;  Cervical Three-Four Cervical Five-Six Anterior Cervical Decompression/Diskectomy,Fusion  . CATARACTS  JANUARY   LEFT EYE  . COLONOSCOPY    . dental implants    . EYE SURGERY     Cataract left  . HEMORRHOID SURGERY    . Retina implant    . SPINE SURGERY      Family History  Problem Relation Age of Onset  . Colon cancer Mother   . Esophageal cancer Neg Hx   . Rectal cancer Neg Hx   . Stomach cancer Neg Hx     Social history:  reports that  has never smoked. she has never used smokeless tobacco. She reports that she drinks about 1.2 oz of alcohol per week. She reports that she does not use drugs.  Medications:  Prior to Admission medications   Medication Sig Start Date End Date Taking? Authorizing Provider  Cyanocobalamin (B-12) 5000 MCG SUBL Place 1 tablet under the tongue daily.   Yes [provider]  gabapentin (NEURONTIN) 300 MG capsule Take 300 mg by mouth at bedtime as needed for itching. 08/17/14  Yes [provider]  lisinopril-hydrochlorothiazide (PRINZIDE,ZESTORETIC) 10-12.5 MG tablet Take 1 tablet by mouth daily.   Yes [provider]  LORazepam (ATIVAN) 1 MG tablet Take 0.25-0.5 mg by mouth every 8 (eight) hours.   Yes [provider]  LYSINE PO Take 300 mg by mouth daily.   Yes [provider]  oxyCODONE-acetaminophen (PERCOCET/ROXICET) 5-325 MG tablet Take 1 tablet by mouth every 4 (four) hours as needed for severe pain. 01/30/17  Yes Daleen Bo, MD  Vitamin A 15000 UNITS TABS Take 1 tablet by mouth daily.    Yes [provider]      Allergies  Allergen Reactions  . Prednisone Itching, Nausea Only and Other (See Comments)    Severe  headache, made her feel like she had the flu.  . Pregabalin Rash  . Valium [Diazepam]     Altered mental status    ROS:  Out of a complete 14 system review of symptoms, the patient complains only of the following symptoms, and all other reviewed systems are negative.  Right flank pain Decreased visual acuity, retinitis pigmentosa  Blood pressure 118/73, pulse 74, height 5\' 5"  (1.651 m), weight 151 lb 8 oz (68.7 kg).  Physical Exam  General: The patient is alert and cooperative at the time of the examination.  Eyes: Pupils are equal, round, and reactive to light. Discs are flat bilaterally.  Neck: The neck is supple, no carotid bruits are noted.  Respiratory: The respiratory examination is clear.  Cardiovascular: The cardiovascular examination reveals a regular rate and rhythm, no obvious murmurs or rubs are noted.  Skin: Extremities are without significant edema.  Neurologic Exam  Mental status: The patient is alert and oriented x 3 at the time of the examination. The patient has apparent normal recent and remote memory, with an apparently normal attention span and concentration ability.  Cranial nerves: Facial symmetry is present. There is good sensation of the face to pinprick and soft touch bilaterally. The strength of the facial muscles and the muscles to head turning and shoulder shrug are normal bilaterally. Speech is well enunciated, no aphasia or dysarthria is noted. Extraocular movements are full. Visual fields are quite limited, the patient is unable to count fingers accurately. The tongue is midline, and the patient has symmetric elevation of the soft palate. No obvious hearing deficits are noted.  Motor: The motor testing reveals 5 over 5 strength of all 4 extremities. Good symmetric motor tone is noted throughout.  Sensory: Sensory testing is intact to pinprick, soft touch, vibration sensation, and position sense on all 4 extremities, with exception of some decrease  in position sense of the right greater than left foot. No evidence of extinction is noted.  Coordination: Cerebellar testing reveals good finger-nose-finger and heel-to-shin bilaterally.  Gait and station: Gait is normal. Tandem gait is normal. Romberg is negative. No drift is seen.  Reflexes: Deep tendon reflexes are symmetric and normal bilaterally. Toes are downgoing bilaterally.   Assessment/Plan:  1.  Episodic right thoracic pain  2.  Progressive blindness, retinitis pigmentosa  The patient has had episodes of pain lasting several days with full resolution.  No skin rashes been noted.  The history is very unusual for shingles.  The patient will need to be evaluated for structural nerve or spinal cord compression, or a radiculitis from another etiology such as vasculitis.  The patient has had a prior check for Lyme disease, blood sugars have been unremarkable, no history of diabetes.  The patient  was sent for blood work today.  She will be set up for MRI of the cervical spine and thoracic spine.  She will follow-up within the next 6-8 weeks.  She will be placed on low-dose carbamazepine taking 100 mg twice daily for the discomfort.  He cannot tolerate prednisone well.  She cannot take higher doses than 300 mg of gabapentin in the evening.   Jill Alexanders MD 01/31/2017 12:53 PM  [facility] [address] [place], [place] [address]  Phone [phone] Fax [phone]

## 2017-02-03 LAB — PAN-ANCA
ANCA Proteinase 3: <3.5 U/mL (ref 0.0–3.5)
Atypical pANCA: <1:20 {titer}
C-ANCA: <1:20 {titer}

## 2017-02-03 LAB — SEDIMENTATION RATE: SED RATE: 3 mm/h (ref 0–40)

## 2017-02-03 LAB — ANA W/REFLEX: ANA: NEGATIVE

## 2017-02-03 LAB — RHEUMATOID FACTOR: RHEUMATOID FACTOR: 13.2 [IU]/mL (ref 0.0–13.9)

## 2017-02-03 LAB — ANGIOTENSIN CONVERTING ENZYME: Angio Convert Enzyme: 47 U/L (ref 14–82)

## 2017-02-03 LAB — VITAMIN B12: VITAMIN B 12: 1754 pg/mL — AB (ref 232–1245)

## 2017-02-05 ENCOUNTER — Telehealth: Payer: Self-pay | Admitting: *Deleted

## 2017-02-05 ENCOUNTER — Telehealth: Payer: Self-pay | Admitting: Neurology

## 2017-02-05 NOTE — Telephone Encounter (Signed)
-----   Message from Kathrynn Ducking, MD sent at 02/04/2017  1:57 PM EST -----  The blood work results are unremarkable. Please call the patient.  ----- Message ----- From: Lavone Neri Lab Results In Sent: 02/01/2017   7:42 AM To: Kathrynn Ducking, MD

## 2017-02-05 NOTE — Telephone Encounter (Signed)
Noted, faxed order

## 2017-02-05 NOTE — Telephone Encounter (Signed)
Jonna with [facility] is calling to get an order faxed for the patient to have an MRI.at their facility. Please fax to [phone]. The patient does not want to go to [facility].

## 2017-02-05 NOTE — Telephone Encounter (Signed)
LVM for pt relaying labs unremarkable per CW,MD note. Gave [facility] phone number if they have further questions/concerns.

## 2017-02-06 ENCOUNTER — Ambulatory Visit: Payer: BLUE CROSS/BLUE SHIELD | Admitting: Neurology

## 2017-02-07 NOTE — Telephone Encounter (Signed)
Patient is schedule to have her MRI's at [facility] on Thursday 02/08/17.

## 2017-02-12 ENCOUNTER — Other Ambulatory Visit: Payer: BLUE CROSS/BLUE SHIELD

## 2017-02-13 ENCOUNTER — Telehealth: Payer: Self-pay | Admitting: Neurology

## 2017-02-13 MED ORDER — TIZANIDINE HCL 2 MG PO TABS: 2.0000 mg | ORAL_TABLET | Freq: Three times a day (TID) | ORAL | 3 refills | Status: DC | PRN

## 2017-02-13 NOTE — Telephone Encounter (Signed)
I called the patient.  MRI of the thoracic spine shows no acute findings, there is a 8 mm area of bone marrow edema at the T4 spinous process, a bone scan was recommended.  The patient otherwise has no evidence of spinal stenosis or nerve root impingement.  MRI of the cervical spine reveals evidence of prior surgery at the C3 through C6 levels, no evidence of spinal stenosis is seen, no clear evidence of nerve root impingement was seen, there is a moderate level of right-sided neuroforaminal stenosis at the C6-7 level.  Overall, the studies do not show any structural cause for her intermittent pain.  The patient reports that she currently is feeling normal.  I will call in a prescription for tizanidine to take when she starts getting the burning sensations and muscle spasms.  We will consider further blood work for stiff person syndrome when she is seen next.  She will stop the carbamazepine at this time.  I have discussed the possibility of getting the bone scan at this time, the patient wishes to hold off on this currently, this may need to be considered in the future.  The bone marrow edema at the T4 spinous process likely has nothing to do with her current pain syndrome.  A prescription for tizanidine will be given so that she can take this at the onset of her next pain episode.

## 2017-03-19 ENCOUNTER — Ambulatory Visit: Payer: BLUE CROSS/BLUE SHIELD | Admitting: Neurology

## 2017-04-02 ENCOUNTER — Ambulatory Visit: Payer: BLUE CROSS/BLUE SHIELD | Admitting: Neurology

## 2017-04-02 ENCOUNTER — Ambulatory Visit: Payer: BLUE CROSS/BLUE SHIELD | Admitting: Family

## 2017-04-02 ENCOUNTER — Encounter: Payer: Self-pay | Admitting: Family

## 2017-04-02 DIAGNOSIS — B0223 Postherpetic polyneuropathy: Secondary | ICD-10-CM | POA: Insufficient documentation

## 2017-04-02 NOTE — Progress Notes (Signed)
Subjective:    Patient ID: Crystal Stokes, female    DOB: 1940-09-28, 77 y.o.   MRN: [id number]  Chief Complaint  Patient presents with  . Herpes Zoster    HPI:  Crystal Stokes is a 77 y.o. female presenting today for evaluation of shingles.  Per her medical history, [name] experienced a shingles outbreak about 8 or 9 years ago affecting the upper buttocks area on the left, the left mid back, and the left shoulder. These events were reported at the same time. She has been on acyclovir and valacyclovir in the past for suppresion. She was seen by Neurology in November having pain problems every 6 weeks or so that last 2-4 days described as burning and would make her muscles spasm. She was diagnosed at the time with episodic right thoracic pain and an MRI performed. Per Dr. Tobey Grim notes the thoracic spine had no acute finding with a 8 mm area of bone marrow edema at the T4 spinous process with a bone scan recommended; no other evidence of spinal stenosis or nerve root impingement was noted. The cervical spine had evidence of prior surgery of C3-C6 with no evidence of nerve root impingement and a moderate level of stenosis at C6-C7. Initially started on carbamazipine but now stopped. Dr. Jannifer Franklin wished to explore the possibility of stiff person syndrome as a differential.   Previous shingle outbreaks have been located around her right shoulder, left elbow, right thoracic spine and her naval. She has not had any rashes recently but continues to experience the pain in these areas especially the thoracic spine described as sharp and burning. Modifying factors include gabapentin which she takes at night and acyclovir 200 mg twice daily. When she experiences increasing symptoms she increases the acyclovir to 400 mg tid for 7 days.   She has received the Shingrix vaccination which made her "violently ill" from December 2017 until May of 2018. Her husband was also sick during this time. She has not  had the Zostavax vaccination.   Denies any current rashes, fevers, chills, night sweats, weight loss, nasuea, vomiting or diarrhea.    Allergies  Allergen Reactions  . Prednisone Itching, Nausea Only and Other (See Comments)    Severe headache, made her feel like she had the flu.  . Pregabalin Rash  . Valium [Diazepam]     Altered mental status      Outpatient Medications Prior to Visit  Medication Sig Dispense Refill  . acyclovir (ZOVIRAX) 200 MG capsule Take 200 mg by mouth 5 (five) times daily. 1 tablet twice daily. For outbreaks 2 capsules tid for seven days.    . Cyanocobalamin (B-12) 5000 MCG SUBL Place 1 tablet under the tongue daily.    Marland Kitchen ezetimibe (ZETIA) 10 MG tablet Take 10 mg by mouth daily.    Marland Kitchen gabapentin (NEURONTIN) 300 MG capsule Take 300 mg by mouth at bedtime as needed for itching.    . hydrochlorothiazide (HYDRODIURIL) 12.5 MG tablet Take 6.25 mg by mouth daily.    Marland Kitchen losartan (COZAAR) 25 MG tablet Take 25 mg by mouth daily.    Marland Kitchen LYSINE PO Take 300 mg by mouth daily.    . Vitamin A 15000 UNITS TABS Take 1 tablet by mouth daily.     Marland Kitchen lisinopril-hydrochlorothiazide (PRINZIDE,ZESTORETIC) 10-12.5 MG tablet Take 1 tablet by mouth daily.    . carbamazepine (TEGRETOL) 100 MG chewable tablet Chew 1 tablet (100 mg total) by mouth 2 (two) times daily. (Patient not  taking: Reported on 04/02/2017) 60 tablet 1  . LORazepam (ATIVAN) 1 MG tablet Take 0.25-0.5 mg by mouth every 8 (eight) hours.    Marland Kitchen oxyCODONE-acetaminophen (PERCOCET/ROXICET) 5-325 MG tablet Take 1 tablet by mouth every 4 (four) hours as needed for severe pain. (Patient not taking: Reported on 04/02/2017) 10 tablet 0  . tiZANidine (ZANAFLEX) 2 MG tablet Take 1 tablet (2 mg total) by mouth every 8 (eight) hours as needed for muscle spasms. (Patient not taking: Reported on 04/02/2017) 60 tablet 3   Facility-Administered Medications Prior to Visit  Medication Dose Route Frequency Provider Last Rate Last Dose  . 0.9 %   sodium chloride infusion  500 mL Intravenous Continuous Irene Shipper, MD         Past Medical History:  Diagnosis Date  . Cataract    LEFT EYE REMOVED IN JAN 2012  . Diverticulitis   . Family history of colon cancer   . Hemorrhoids   . History of shingles     8 times per pt!  . Hyperlipidemia   . Hypertension   . Legal blindness    since 77 years old  . Neuromuscular disorder ([place])    per pt, not aware of this dx  . Retinitis pigmentosa of both eyes    caused the blindness      Past Surgical History:  Procedure Laterality Date  . ANTERIOR CERVICAL DECOMP/DISCECTOMY FUSION  10/12/2011   Procedure: ANTERIOR CERVICAL DECOMPRESSION/DISCECTOMY FUSION 2 LEVELS;  Surgeon: Faythe Ghee, MD;  Location: [facility];  Service: Neurosurgery;  Laterality: N/A;  Cervical Three-Four Cervical Five-Six Anterior Cervical Decompression/Diskectomy,Fusion  . CATARACTS  JANUARY   LEFT EYE  . COLONOSCOPY    . dental implants    . EYE SURGERY     Cataract left  . HEMORRHOID SURGERY    . Retina implant    . SPINE SURGERY        Family History  Problem Relation Age of Onset  . Colon cancer Mother   . Retinitis pigmentosa Paternal Grandmother   . Esophageal cancer Neg Hx   . Rectal cancer Neg Hx   . Stomach cancer Neg Hx       Social History   Socioeconomic History  . Marital status: Married    Spouse name: Not on file  . Number of children: 0    . Years of education: Not on file  . Highest education level: Not on file  Social Needs  . Financial resource strain: Not on file  . Food insecurity - worry: Not on file  . Food insecurity - inability: Not on file  . Transportation needs - medical: Not on file  . Transportation needs - non-medical: Not on file  Occupational History  . Occupation: Armed forces operational officer  Tobacco Use  . Smoking status: Never Smoker  . Smokeless tobacco: Never Used  Substance and Sexual Activity  . Alcohol use: Yes    Alcohol/week: 1.2 oz    Types: 2  Glasses of wine per week  . Drug use: No  . Sexual activity: Not on file  Other Topics Concern  . Not on file  Social History Narrative   Lives   Daily caffeine 2 per day      Review of Systems  Constitutional: Positive for fatigue. Negative for activity change, chills and fever.  Respiratory: Negative for cough, chest tightness and shortness of breath.   Cardiovascular: Negative for chest pain, palpitations and leg swelling.  Gastrointestinal: Negative for  constipation, diarrhea, nausea and vomiting.  Musculoskeletal: Positive for back pain (Right side thoracic pain).  Skin: Positive for color change. Negative for rash and wound.  Neurological: Negative for dizziness, tremors, seizures, weakness, light-headedness, numbness and headaches.       Objective:    BP 130/76   Pulse 67   Temp (!) 97 F (36.1 C) (Oral)   Wt 140 lb (63.5 kg)   BMI 23.30 kg/m  Nursing note and vital signs reviewed.  Physical Exam  Constitutional:  Pleasant; seated in the chair and in good spirits.   Cardiovascular: Normal rate, normal heart sounds and intact distal pulses. Exam reveals no gallop and no friction rub.  No murmur heard. Pulmonary/Chest: Effort normal and breath sounds normal. No respiratory distress. She has no wheezes. She has no rales. She exhibits no tenderness.  Skin:  Back - There is no obvious deformity. Mild redness around the right shoulder with numerous lightly brown colored macules that are non-tender.         Assessment & Plan:   Problem List Items Addressed This Visit      Nervous and Auditory   Post-herpetic polyneuropathy    Ms. Sorbo appears to have post-herpatic polyneuropathy in the absence of orthopedic origin from previous MRI. The pain she is describing is consistent with neuropathic type pains, but does not fit with the previously mentioned muscle spasms. She remains on acyclovir for prophylaxis. Would recommend using valacyclovir for outbreaks to see if  that improves her symptoms. Decreasing the dosage of gabapentin to 100 mg capsules may provide her with some relief during the day as 300 mg at night makes her sleepy. Unfortunately she has a reaction to the Shingrix vaccination that I think was related to other illness as opposed to the vaccination itself. Recommend she continue to follow up with Neurology and PCP. Glad to see her back as needed.           I have discontinued Leda Gauze A. Hermida's lisinopril-hydrochlorothiazide, LORazepam, oxyCODONE-acetaminophen, carbamazepine, and tiZANidine. I am also having her maintain her Vitamin A, B-12, gabapentin, LYSINE PO, losartan, ezetimibe, hydrochlorothiazide, and acyclovir. We will continue to administer sodium chloride.   Follow-up:  As needed  Mauricio Po, [facility] for Infectious Disease

## 2017-04-02 NOTE — Patient Instructions (Signed)
Nice to meet you!  Please continue to take you acyclovir as prescribed for prevention.   Consider using Valacyclovir for outbreaks. 1 g 3 times daily for 7 days  Consider changing the gabapentin to 100 mg capsules to allow for daily dosing and avoid sedation.   Work on stress relief and ensure that you are keeping yourself healthy!  I will look to see if there are any studies regarding Shingles.   Postherpetic Neuralgia Postherpetic neuralgia (PHN) is nerve pain that occurs after a shingles infection. Shingles is a painful rash that appears on one side of the body, usually on your trunk or face. Shingles is caused by the varicella-zoster virus. This is the same virus that causes chickenpox. In people who have had chickenpox, the virus can resurface years later and cause shingles. You may have PHN if you continue to have pain for 3 months after your shingles rash has gone away. PHN appears in the same area where you had the shingles rash. For most people, PHN goes away within 1 year. Getting a vaccination for shingles can prevent PHN. This vaccine is recommended for people older than 50. It may prevent shingles and may also lower your risk of PHN if you do get shingles. What are the causes? PHN is caused by damage to your nerves from the varicella-zoster virus. This damage makes your nerves overly sensitive. What increases the risk? Aging is the biggest risk factor for developing PHN. Most people who get PHN are older than 65. Other risk factors include:  Having very bad pain before your shingles rash starts.  Having a very bad rash.  Having shingles in the nerve that supplies your face and eye (trigeminal nerve).  What are the signs or symptoms? Pain is the main symptom of PHN. The pain is often very bad and may be described as stabbing, burning, or feeling like an electric shock. The pain may come and go or may be there all the time. Pain may be triggered by light touches on the skin or  changes in temperature. You may have itching along with the pain. How is this diagnosed? Your health care provider may diagnose PHN based on your symptoms and your history of shingles. Lab studies and other diagnostic tests are usually not needed. How is this treated? There is no cure for PHN. Treatment for PHN will focus on pain relief. Over-the-counter pain relievers do not usually relieve PHN pain. You may need to work with a pain specialist. Treatment may include:  Antidepressant medicines to help with pain and improve sleep.  Antiseizure medicines to relieve nerve pain.  Strong pain relievers (opioids).  A numbing patch worn on the skin (lidocaine patch).  Follow these instructions at home: It may take a long time to recover from PHN. Work closely with your health care provider, and have a good support system at home.  Take all medicines as directed by your health care provider.  Wear loose, comfortable clothing.  Cover sensitive areas with a dressing to reduce friction from clothing rubbing on the area.  If cold does not make your pain worse, try applying a cool compress or cooling gel pack to the area.  Talk to your health care provider if you feel depressed or desperate. Living with long-term pain can be depressing.  Contact a health care provider if:  Your medicine is not helping.  You are struggling to manage your pain at home. This information is not intended to replace advice given to  you by your health care provider. Make sure you discuss any questions you have with your health care provider. Document Released: 05/20/2002 Document Revised: 08/05/2015 Document Reviewed: 02/18/2013 Elsevier Interactive Patient Education  [employer].

## 2017-04-02 NOTE — Assessment & Plan Note (Signed)
Crystal Stokes appears to have post-herpatic polyneuropathy in the absence of orthopedic origin from previous MRI. The pain she is describing is consistent with neuropathic type pains, but does not fit with the previously mentioned muscle spasms. She remains on acyclovir for prophylaxis. Would recommend using valacyclovir for outbreaks to see if that improves her symptoms. Decreasing the dosage of gabapentin to 100 mg capsules may provide her with some relief during the day as 300 mg at night makes her sleepy. Unfortunately she has a reaction to the Shingrix vaccination that I think was related to other illness as opposed to the vaccination itself. Recommend she continue to follow up with Neurology and PCP. Glad to see her back as needed.

## 2018-01-31 IMAGING — CR DG CHEST 2V
2 series · 2 of 2 positions shown · non-contrast
Comparison: 10/12/2011

CLINICAL DATA: Patient c/o intermittent fever and chills x several
days; hx HTN

EXAM:
CHEST  2 VIEW

[w chest pa]
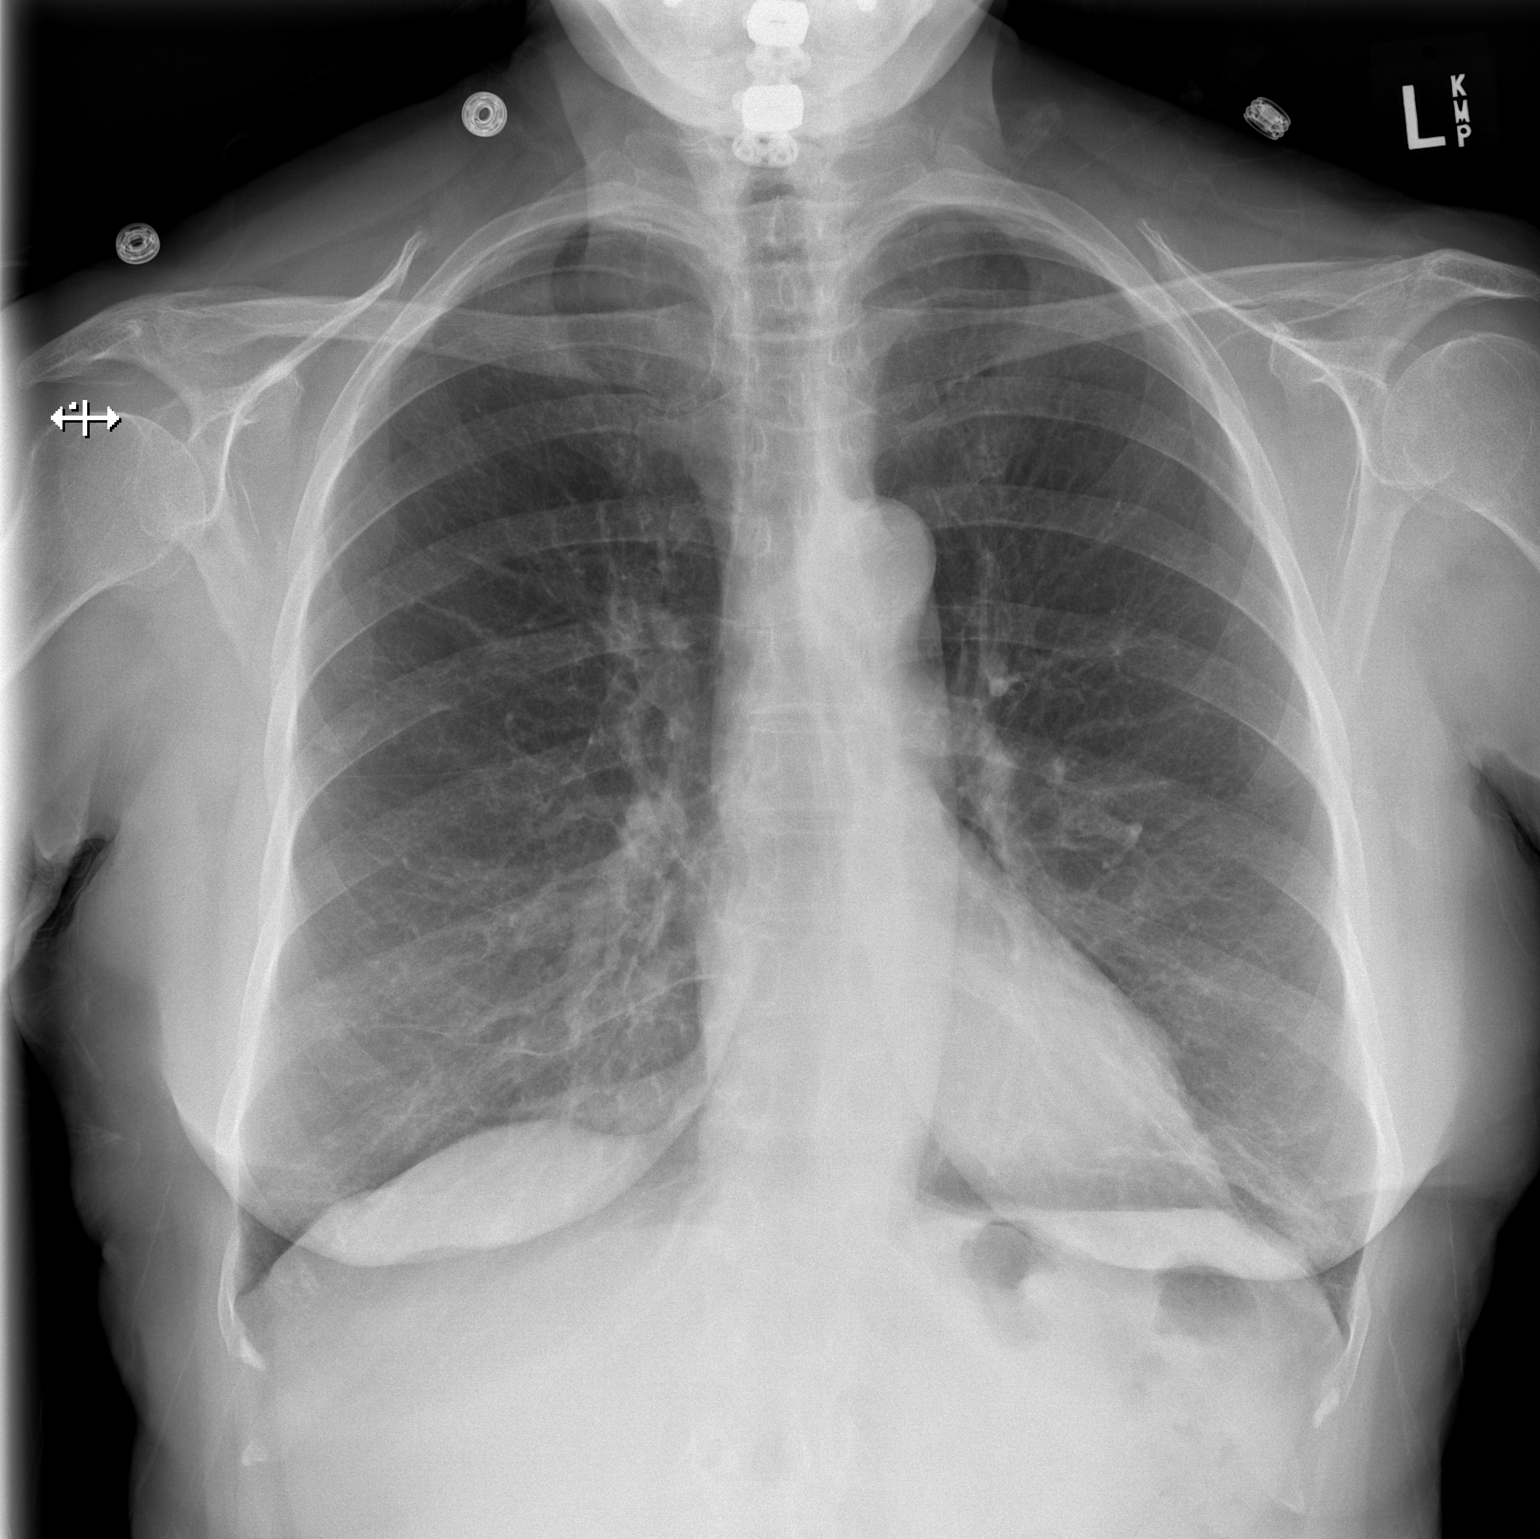

[w chest lat]
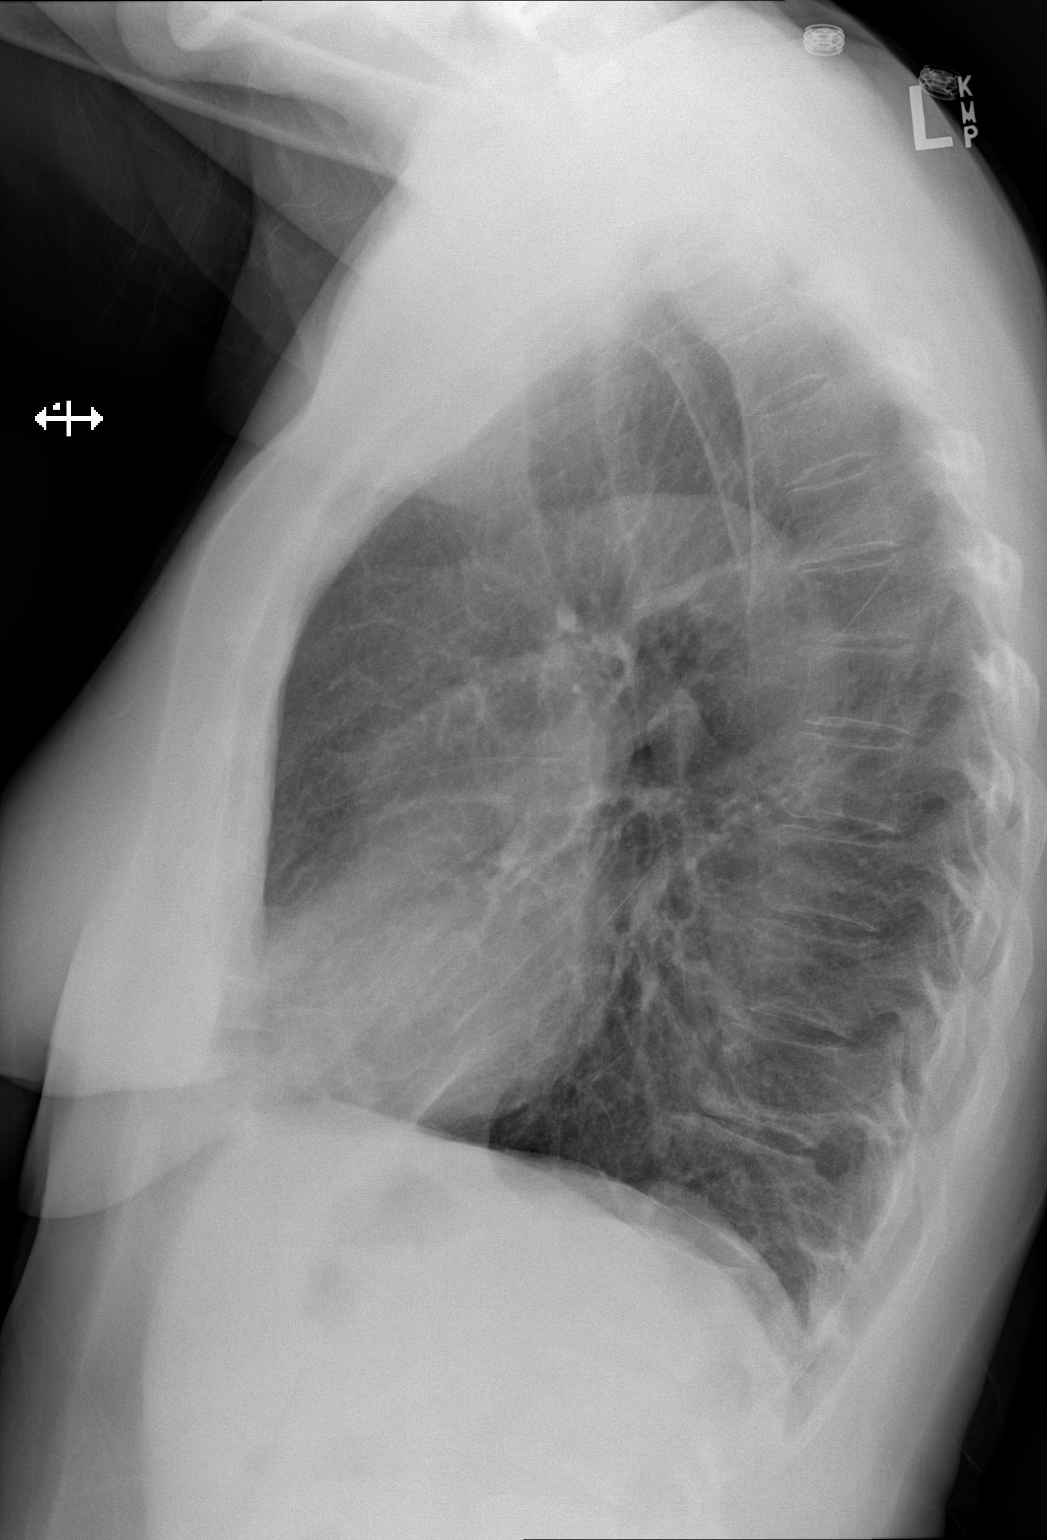

[2 of 2 positions shown; findings below may reference images not displayed]

FINDINGS: Lower cervical fusion. Bandlike density in the lingula favoring
atelectasis or mild scarring. Cardiac and mediastinal margins appear
normal. No pleural effusion.
IMPRESSION: 1. Linear subsegmental atelectasis or scarring in the lingula.
Otherwise the lungs appear clear.

## 2018-04-16 DIAGNOSIS — M25512 Pain in left shoulder: Secondary | ICD-10-CM | POA: Diagnosis not present

## 2018-04-16 DIAGNOSIS — M7062 Trochanteric bursitis, left hip: Secondary | ICD-10-CM | POA: Diagnosis not present

## 2018-05-01 ENCOUNTER — Ambulatory Visit
Admission: RE | Admit: 2018-05-01 | Discharge: 2018-05-01 | Disposition: A | Discharge status: Home / Self Care | Payer: BLUE CROSS/BLUE SHIELD | Source: Ambulatory Visit | Attending: *Deleted | Admitting: *Deleted

## 2018-05-01 ENCOUNTER — Other Ambulatory Visit: Payer: Self-pay | Admitting: *Deleted

## 2018-05-01 DIAGNOSIS — R05 Cough: Secondary | ICD-10-CM

## 2018-05-01 DIAGNOSIS — R059 Cough, unspecified: Secondary | ICD-10-CM

## 2018-05-02 ENCOUNTER — Emergency Department (HOSPITAL_COMMUNITY)
Admission: EM | Admit: 2018-05-02 | Discharge: 2018-05-02 | Disposition: A | Discharge status: Home / Self Care | Payer: BLUE CROSS/BLUE SHIELD | Attending: Emergency Medicine | Admitting: Emergency Medicine

## 2018-05-02 ENCOUNTER — Encounter (HOSPITAL_COMMUNITY): Payer: Self-pay | Admitting: Emergency Medicine

## 2018-05-02 ENCOUNTER — Other Ambulatory Visit: Payer: Self-pay

## 2018-05-02 DIAGNOSIS — I10 Essential (primary) hypertension: Secondary | ICD-10-CM | POA: Diagnosis not present

## 2018-05-02 DIAGNOSIS — M62830 Muscle spasm of back: Secondary | ICD-10-CM | POA: Insufficient documentation

## 2018-05-02 DIAGNOSIS — Z79899 Other long term (current) drug therapy: Secondary | ICD-10-CM | POA: Insufficient documentation

## 2018-05-02 MED ORDER — HYDROMORPHONE HCL 1 MG/ML IJ SOLN
1.0000 mg | Freq: Once | INTRAMUSCULAR | Status: AC
  Administered 2018-05-02: 1 mg via INTRAMUSCULAR
  Filled 2018-05-02: qty 1

## 2018-05-02 NOTE — ED Triage Notes (Signed)
Patient presents with lumbar spasms since last Friday. Patient states coughing since Friday. Was seen by doctor and placed on multiple medications. Patient states she took 3 gabapentin and a valium about 1 hour ago with no relief. Patient wearing husbands old back brace with some relief.

## 2018-05-02 NOTE — Discharge Instructions (Addendum)

## 2018-05-02 NOTE — ED Provider Notes (Signed)
[facility] DEPT Provider Note   CSN: [id number] Arrival date & time: 05/02/18  1062    History   Chief Complaint Chief Complaint  Patient presents with  . Spasms    HPI Crystal Stokes is a 78 y.o. female.     The history is provided by the patient and the spouse.  Back Pain  Pain location: upper back. Quality: spasms. Radiates to:  Does not radiate Pain severity:  Severe Onset quality:  Sudden Duration:  6 hours Timing:  Constant Progression:  Unchanged Chronicity:  New Context comment:  Coughing Relieved by:  Nothing Worsened by:  Nothing Associated symptoms: no abdominal pain, no chest pain, no fever and no weakness   Patient reports onset of upper back spasms.  She denies trauma.  She reports for the past several days she has had persistent cough.  Several hours ago after coughing, she began having spasms and pain throughout her upper back.  No chest pain or shortness of breath.  First thought it was her shingles, and she started her acyclovir.  She then took gabapentin and diazepam without relief No other acute symptoms. She was recently treated for cough with oral antibiotics  Past Medical History:  Diagnosis Date  . Cataract    LEFT EYE REMOVED IN JAN 2012  . Diverticulitis   . Family history of colon cancer   . Hemorrhoids   . History of shingles     8 times per pt!  . Hyperlipidemia   . Hypertension   . Legal blindness    since 78 years old  . Neuromuscular disorder ([place])    per pt, not aware of this dx  . Retinitis pigmentosa of both eyes    caused the blindness    Patient Active Problem List   Diagnosis Date Noted  . Post-herpetic polyneuropathy 04/02/2017  . Cervical neck pain with evidence of disc disease 02/18/2013  . DIVERTICULITIS, COLON 07/13/2008  . ABDOMINAL PAIN, LEFT LOWER QUADRANT 07/13/2008  . HYPERTENSION NEC 07/10/2008    Past Surgical History:  Procedure Laterality Date  . ANTERIOR CERVICAL  DECOMP/DISCECTOMY FUSION  10/12/2011   Procedure: ANTERIOR CERVICAL DECOMPRESSION/DISCECTOMY FUSION 2 LEVELS;  Surgeon: Faythe Ghee, MD;  Location: [facility];  Service: Neurosurgery;  Laterality: N/A;  Cervical Three-Four Cervical Five-Six Anterior Cervical Decompression/Diskectomy,Fusion  . CATARACTS  JANUARY   LEFT EYE  . COLONOSCOPY    . dental implants    . EYE SURGERY     Cataract left  . HEMORRHOID SURGERY    . Retina implant    . SPINE SURGERY       OB History   No obstetric history on file.      Home Medications    Prior to Admission medications   Medication Sig Start Date End Date Taking? Authorizing Provider  acyclovir (ZOVIRAX) 200 MG capsule Take 200 mg by mouth 5 (five) times daily. 1 tablet twice daily. For outbreaks 2 capsules tid for seven days.    [provider]  Cyanocobalamin (B-12) 5000 MCG SUBL Place 1 tablet under the tongue daily.    [provider]  ezetimibe (ZETIA) 10 MG tablet Take 10 mg by mouth daily.    [provider]  gabapentin (NEURONTIN) 300 MG capsule Take 300 mg by mouth at bedtime as needed for itching. 08/17/14   [provider]  hydrochlorothiazide (HYDRODIURIL) 12.5 MG tablet Take 6.25 mg by mouth daily.    [provider]  losartan (  COZAAR) 25 MG tablet Take 25 mg by mouth daily.    [provider]  LYSINE PO Take 300 mg by mouth daily.    [provider]  Vitamin A 15000 UNITS TABS Take 1 tablet by mouth daily.     [provider]    Family History Family History  Problem Relation Age of Onset  . Colon cancer Mother   . Retinitis pigmentosa Paternal Grandmother   . Esophageal cancer Neg Hx   . Rectal cancer Neg Hx   . Stomach cancer Neg Hx     Social History Social History   Tobacco Use  . Smoking status: Never Smoker  . Smokeless tobacco: Never Used  Substance Use Topics  . Alcohol use: Yes    Alcohol/week: 2.0 standard drinks    Types: 2 Glasses  of wine per week  . Drug use: No     Allergies   Prednisone; Pregabalin; and Valium [diazepam]   Review of Systems Review of Systems  Constitutional: Negative for fever.  Respiratory: Positive for cough. Negative for shortness of breath.   Cardiovascular: Negative for chest pain.  Gastrointestinal: Negative for abdominal pain.  Musculoskeletal: Positive for back pain.  Neurological: Negative for weakness.  All other systems reviewed and are negative.    Physical Exam Updated Vital Signs BP (!) 156/85 (BP Location: Left Arm)   Pulse 76   Resp 16   SpO2 98%   Physical Exam CONSTITUTIONAL: Well developed, anxious  HEAD: Normocephalic/atraumatic EYES: EOMI ENMT: Mucous membranes moist NECK: supple no meningeal signs SPINE/BACK:entire spine nontender, diffuse parathoracic tenderness, no bruising or erythema noted CV: S1/S2 noted, no murmurs/rubs/gallops noted LUNGS: Lungs are clear to auscultation bilaterally, no apparent distress ABDOMEN: soft, nontender, no rebound or guarding, bowel sounds noted throughout abdomen GU:no cva tenderness NEURO: Pt is awake/alert/appropriate, moves all extremitiesx4.  No facial droop.  Equal power noted in bilateral Lower extremities EXTREMITIES: pulses normal/equal, full ROM SKIN: warm, color normal, no rash noted to her back  PSYCH: anxious  ED Treatments / Results  Labs (all labs ordered are listed, but only abnormal results are displayed) Labs Reviewed - No data to display  EKG None  Radiology  Procedures Procedures    Medications Ordered in ED Medications  HYDROmorphone (DILAUDID) injection 1 mg (1 mg Intramuscular Given 05/02/18 0437)     Initial Impression / Assessment and Plan / ED Course  I have reviewed the triage vital signs and the nursing notes.           Patient presented for muscle spasms that were triggered by coughing.  She appears uncomfortable but otherwise no acute distress. She responded to pain  medications.  She is in no acute distress. No chest pain or shortness of breath reported.  No signs of any acute neurologic emergency. Patient feels comfortable for discharge home.  We discussed return precautions  Final Clinical Impressions(s) / ED Diagnoses   Final diagnoses:  Muscle spasm of back    ED Discharge Orders    None       Ripley Fraise, MD 05/02/18 [phone]

## 2018-05-06 DIAGNOSIS — H6983 Other specified disorders of Eustachian tube, bilateral: Secondary | ICD-10-CM | POA: Diagnosis not present

## 2018-05-06 DIAGNOSIS — H6502 Acute serous otitis media, left ear: Secondary | ICD-10-CM | POA: Diagnosis not present

## 2018-05-14 DIAGNOSIS — M7062 Trochanteric bursitis, left hip: Secondary | ICD-10-CM | POA: Diagnosis not present

## 2018-05-14 DIAGNOSIS — M25512 Pain in left shoulder: Secondary | ICD-10-CM | POA: Diagnosis not present

## 2018-05-21 DIAGNOSIS — M7062 Trochanteric bursitis, left hip: Secondary | ICD-10-CM | POA: Diagnosis not present

## 2018-05-21 DIAGNOSIS — M25512 Pain in left shoulder: Secondary | ICD-10-CM | POA: Diagnosis not present

## 2018-05-28 DIAGNOSIS — M7062 Trochanteric bursitis, left hip: Secondary | ICD-10-CM | POA: Diagnosis not present

## 2018-05-28 DIAGNOSIS — M25512 Pain in left shoulder: Secondary | ICD-10-CM | POA: Diagnosis not present

## 2018-06-07 DIAGNOSIS — M7062 Trochanteric bursitis, left hip: Secondary | ICD-10-CM | POA: Diagnosis not present

## 2018-06-07 DIAGNOSIS — M25512 Pain in left shoulder: Secondary | ICD-10-CM | POA: Diagnosis not present

## 2018-06-10 DIAGNOSIS — M25512 Pain in left shoulder: Secondary | ICD-10-CM | POA: Diagnosis not present

## 2018-06-10 DIAGNOSIS — M7062 Trochanteric bursitis, left hip: Secondary | ICD-10-CM | POA: Diagnosis not present

## 2018-06-27 DIAGNOSIS — M7062 Trochanteric bursitis, left hip: Secondary | ICD-10-CM | POA: Diagnosis not present

## 2018-07-09 DIAGNOSIS — H6123 Impacted cerumen, bilateral: Secondary | ICD-10-CM | POA: Diagnosis not present

## 2018-07-15 DIAGNOSIS — M25512 Pain in left shoulder: Secondary | ICD-10-CM | POA: Diagnosis not present

## 2018-10-03 DIAGNOSIS — L820 Inflamed seborrheic keratosis: Secondary | ICD-10-CM | POA: Diagnosis not present

## 2018-10-03 DIAGNOSIS — L720 Epidermal cyst: Secondary | ICD-10-CM | POA: Diagnosis not present

## 2018-11-04 DIAGNOSIS — M7062 Trochanteric bursitis, left hip: Secondary | ICD-10-CM | POA: Diagnosis not present

## 2018-11-04 DIAGNOSIS — M25552 Pain in left hip: Secondary | ICD-10-CM | POA: Diagnosis not present

## 2019-02-14 DIAGNOSIS — M25552 Pain in left hip: Secondary | ICD-10-CM | POA: Diagnosis not present

## 2019-02-17 ENCOUNTER — Telehealth: Payer: Self-pay

## 2019-02-17 NOTE — Telephone Encounter (Signed)
NOTES ON FILE FROM DR Geannie Risen [phone].SENT REFERRAL TO SCHEDULING....

## 2019-02-26 ENCOUNTER — Ambulatory Visit (INDEPENDENT_AMBULATORY_CARE_PROVIDER_SITE_OTHER): Payer: BLUE CROSS/BLUE SHIELD | Admitting: Otolaryngology

## 2019-03-03 ENCOUNTER — Encounter (INDEPENDENT_AMBULATORY_CARE_PROVIDER_SITE_OTHER): Payer: Self-pay | Admitting: Otolaryngology

## 2019-03-03 ENCOUNTER — Ambulatory Visit (INDEPENDENT_AMBULATORY_CARE_PROVIDER_SITE_OTHER): Payer: BC Managed Care – PPO | Admitting: Otolaryngology

## 2019-03-03 ENCOUNTER — Other Ambulatory Visit: Payer: Self-pay

## 2019-03-03 VITALS — Temp 97.9°F

## 2019-03-03 DIAGNOSIS — H6123 Impacted cerumen, bilateral: Secondary | ICD-10-CM

## 2019-03-03 NOTE — Progress Notes (Signed)
HPI: Crystal Stokes is a 78 y.o. female who presents for evaluation of ears.  She complains mostly of itching in her ears.  Not really that much blockage of her ears.  She was last seen about 6 months ago and had her ears cleaned..  Past Medical History:  Diagnosis Date  . Cataract    LEFT EYE REMOVED IN JAN 2012  . Diverticulitis   . Family history of colon cancer   . Hemorrhoids   . History of shingles     8 times per pt!  . Hyperlipidemia   . Hypertension   . Legal blindness    since 78 years old  . Neuromuscular disorder ([place])    per pt, not aware of this dx  . Retinitis pigmentosa of both eyes    caused the blindness   Past Surgical History:  Procedure Laterality Date  . ANTERIOR CERVICAL DECOMP/DISCECTOMY FUSION  10/12/2011   Procedure: ANTERIOR CERVICAL DECOMPRESSION/DISCECTOMY FUSION 2 LEVELS;  Surgeon: Faythe Ghee, MD;  Location: [facility];  Service: Neurosurgery;  Laterality: N/A;  Cervical Three-Four Cervical Five-Six Anterior Cervical Decompression/Diskectomy,Fusion  . CATARACTS  JANUARY   LEFT EYE  . COLONOSCOPY    . dental implants    . EYE SURGERY     Cataract left  . HEMORRHOID SURGERY    . Retina implant    . SPINE SURGERY     Social History   Socioeconomic History  . Marital status: Married    Spouse name: Not on file  . Number of children: 0    . Years of education: Not on file  . Highest education level: Not on file  Occupational History  . Occupation: Armed forces operational officer  Tobacco Use  . Smoking status: Never Smoker  . Smokeless tobacco: Never Used  Substance and Sexual Activity  . Alcohol use: Yes    Alcohol/week: 2.0 standard drinks    Types: 2 Glasses of wine per week  . Drug use: No  . Sexual activity: Not on file  Other Topics Concern  . Not on file  Social History Narrative   Lives   Daily caffeine 2 per day   Social Determinants of Health   Financial Resource Strain:   . Difficulty of Paying Living Expenses: Not on file  Food  Insecurity:   . Worried About Charity fundraiser in the Last Year: Not on file  . Ran Out of Food in the Last Year: Not on file  Transportation Needs:   . Lack of Transportation (Medical): Not on file  . Lack of Transportation (Non-Medical): Not on file  Physical Activity:   . Days of Exercise per Week: Not on file  . Minutes of Exercise per Session: Not on file  Stress:   . Feeling of Stress : Not on file  Social Connections:   . Frequency of Communication with Friends and Family: Not on file  . Frequency of Social Gatherings with Friends and Family: Not on file  . Attends Religious Services: Not on file  . Active Member of Clubs or Organizations: Not on file  . Attends Archivist Meetings: Not on file  . Marital Status: Not on file   Family History  Problem Relation Age of Onset  . Colon cancer Mother   . Retinitis pigmentosa Paternal Grandmother   . Esophageal cancer Neg Hx   . Rectal cancer Neg Hx   . Stomach cancer Neg Hx    Allergies  Allergen Reactions  .  Prednisone Itching, Nausea Only and Other (See Comments)    Severe headache, made her feel like she had the flu.  . Pregabalin Rash  . Valium [Diazepam]     Altered mental status   Prior to Admission medications   Medication Sig Start Date End Date Taking? Authorizing Provider  co-enzyme Q-10 30 MG capsule Take 30 mg by mouth daily.   Yes [provider]  Cyanocobalamin (B-12) 5000 MCG SUBL Place 1 tablet under the tongue daily.   Yes [provider]  ezetimibe (ZETIA) 10 MG tablet Take 10 mg by mouth daily.   Yes [provider]  gabapentin (NEURONTIN) 300 MG capsule Take 300 mg by mouth at bedtime as needed (pain).  08/17/14  Yes [provider]  hydrochlorothiazide (HYDRODIURIL) 12.5 MG tablet Take 6.25 mg by mouth daily.   Yes [provider]  losartan (COZAAR) 25 MG tablet Take 25 mg by mouth daily.   Yes [provider]  LYSINE PO Take 300 mg by  mouth daily.   Yes [provider]  Vitamin A 15000 UNITS TABS Take 1 tablet by mouth daily.    Yes [provider]     Positive ROS: Negative  All other systems have been reviewed and were otherwise negative with the exception of those mentioned in the HPI and as above.  Physical Exam: Constitutional: Alert, well-appearing, no acute distress Ears: External ears without lesions or tenderness. Ear canals she has minimal wax buildup on both sides that was cleaned with curettes.  TMs were clear bilaterally.  No obstruction of her TMs.. Nasal: External nose without lesions. Clear nasal passages Oral: Oropharynx clear. Neck: No palpable adenopathy or masses Respiratory: Breathing comfortably  Skin: No facial/neck lesions or rash noted.  Cerumen impaction removal  Date/Time: 03/03/2019 2:23 PM Performed by: Rozetta Nunnery, MD Authorized by: Rozetta Nunnery, MD   Consent:    Consent obtained:  Verbal   Consent given by:  Patient   Risks discussed:  Pain and bleeding Procedure details:    Location:  L ear and R ear   Procedure type: curette   Post-procedure details:    Inspection:  TM intact and canal normal   Hearing quality:  Improved   Patient tolerance of procedure:  Tolerated well, no immediate complications Comments:     Minimal wax buildup that was cleaned with curettes on both sides.    Assessment: Cerumen buildup  Plan: Her main complaint today were itchy ears and I discussed use of possible cream for itchy ears but she did not want to use anything in the ear unless necessary. She will follow-up as needed.  Radene Journey, MD

## 2019-04-30 DIAGNOSIS — L821 Other seborrheic keratosis: Secondary | ICD-10-CM | POA: Diagnosis not present

## 2019-04-30 DIAGNOSIS — L820 Inflamed seborrheic keratosis: Secondary | ICD-10-CM | POA: Diagnosis not present

## 2019-07-09 DIAGNOSIS — Z1231 Encounter for screening mammogram for malignant neoplasm of breast: Secondary | ICD-10-CM | POA: Diagnosis not present

## 2019-10-22 ENCOUNTER — Other Ambulatory Visit: Payer: Self-pay | Admitting: Obstetrics and Gynecology

## 2019-10-22 DIAGNOSIS — R1032 Left lower quadrant pain: Secondary | ICD-10-CM

## 2019-10-28 ENCOUNTER — Other Ambulatory Visit: Payer: Self-pay | Admitting: Obstetrics and Gynecology

## 2019-10-30 ENCOUNTER — Ambulatory Visit
Admission: RE | Admit: 2019-10-30 | Discharge: 2019-10-30 | Disposition: A | Discharge status: Home / Self Care | Payer: BC Managed Care – PPO | Source: Ambulatory Visit | Attending: Obstetrics and Gynecology | Admitting: Obstetrics and Gynecology

## 2019-10-30 DIAGNOSIS — R1032 Left lower quadrant pain: Secondary | ICD-10-CM | POA: Diagnosis not present

## 2019-10-30 MED ORDER — IOPAMIDOL (ISOVUE-300) INJECTION 61%
100.0000 mL | Freq: Once | INTRAVENOUS | Status: AC | PRN
  Administered 2019-10-30: 100 mL via INTRAVENOUS

## 2020-03-03 DIAGNOSIS — M47812 Spondylosis without myelopathy or radiculopathy, cervical region: Secondary | ICD-10-CM | POA: Diagnosis not present

## 2020-03-23 ENCOUNTER — Encounter: Payer: Self-pay | Admitting: Cardiology

## 2020-03-23 ENCOUNTER — Ambulatory Visit: Payer: BC Managed Care – PPO | Admitting: Cardiology

## 2020-03-23 ENCOUNTER — Other Ambulatory Visit: Payer: Self-pay

## 2020-03-23 VITALS — BP 132/78 | HR 69 | Resp 16 | Ht 65.0 in | Wt 147.0 lb

## 2020-03-23 DIAGNOSIS — I471 Supraventricular tachycardia: Secondary | ICD-10-CM

## 2020-03-23 DIAGNOSIS — R002 Palpitations: Secondary | ICD-10-CM

## 2020-03-23 DIAGNOSIS — I209 Angina pectoris, unspecified: Secondary | ICD-10-CM | POA: Diagnosis not present

## 2020-03-23 DIAGNOSIS — E7800 Pure hypercholesterolemia, unspecified: Secondary | ICD-10-CM

## 2020-03-23 DIAGNOSIS — I10 Essential (primary) hypertension: Secondary | ICD-10-CM

## 2020-03-23 DIAGNOSIS — I4891 Unspecified atrial fibrillation: Secondary | ICD-10-CM | POA: Diagnosis not present

## 2020-03-23 MED ORDER — ASPIRIN EC 81 MG PO TBEC: 81.0000 mg | DELAYED_RELEASE_TABLET | Freq: Every day | ORAL | 11 refills | Status: DC

## 2020-03-23 MED ORDER — VERAPAMIL HCL ER 120 MG PO TBCR: 120.0000 mg | EXTENDED_RELEASE_TABLET | Freq: Every day | ORAL | 0 refills | Status: DC

## 2020-03-23 NOTE — Progress Notes (Signed)
Primary Physician/Referring:  Geannie Risen, MD  Patient ID: Crystal Stokes, female    DOB: August 26, 1940, 80 y.o.   MRN: [id number]  Chief Complaint  Patient presents with  . Atrial Fibrillation  . Follow-up   HPI:    Crystal Stokes  is a 80 y.o. Caucasian female with hypertension, hyperlipidemia, legally blind due to retinitis pigmentosa accompanied by her husband, last evening started having chest tightness and palpitations with radiation to her jaw. Patient's husband has home EKG monitoring which revealed atrial fibrillation. He then presented to the emergency room but as the ED was extremely busy and due to COVID 19, he was able to see his PCP and an EKG was performed which revealed normal sinus rhythm. There were then recommended to follow-up with cardiology, they are seen here today for follow-up.  Patient states that over the past few weeks she has been having episodes of chest tightness in the middle of the chest and radiate to her jaw. Comes with routine activities and last several minutes and spontaneously subsides. She is also having episodes of palpitations that last a few seconds. No shortness of breath, no leg edema, no recent long travel.  Past Medical History:  Diagnosis Date  . Cataract    LEFT EYE REMOVED IN JAN 2012  . Diverticulitis   . Family history of colon cancer   . Hemorrhoids   . History of shingles     8 times per pt!  . Hyperlipidemia   . Hypertension   . Legal blindness    since 80 years old  . Neuromuscular disorder ([place])    per pt, not aware of this dx  . Retinitis pigmentosa of both eyes    caused the blindness   Past Surgical History:  Procedure Laterality Date  . ANTERIOR CERVICAL DECOMP/DISCECTOMY FUSION  10/12/2011   Procedure: ANTERIOR CERVICAL DECOMPRESSION/DISCECTOMY FUSION 2 LEVELS;  Surgeon: Faythe Ghee, MD;  Location: [facility];  Service: Neurosurgery;  Laterality: N/A;  Cervical Three-Four Cervical Five-Six Anterior Cervical  Decompression/Diskectomy,Fusion  . CATARACTS  JANUARY   LEFT EYE  . COLONOSCOPY    . dental implants    . EYE SURGERY     Cataract left  . HEMORRHOID SURGERY    . Retina implant    . SPINE SURGERY     Family History  Problem Relation Age of Onset  . Colon cancer Mother   . Heart failure Brother   . Heart attack Sister   . Retinitis pigmentosa Paternal Grandmother   . Esophageal cancer Neg Hx   . Rectal cancer Neg Hx   . Stomach cancer Neg Hx     Social History   Tobacco Use  . Smoking status: Never Smoker  . Smokeless tobacco: Never Used  Substance Use Topics  . Alcohol use: Yes    Alcohol/week: 2.0 standard drinks    Types: 2 Glasses of wine per week    Comment: occasionally   Marital Status: Married  ROS  Review of Systems  Eyes: Positive for blurred vision (chronic).  Cardiovascular: Positive for chest pain and palpitations. Negative for dyspnea on exertion and leg swelling.  Gastrointestinal: Negative for melena.   Objective  Blood pressure 132/78, pulse 69, resp. rate 16, height 5' 5"  (1.651 m), weight 147 lb (66.7 kg), SpO2 96 %.  Vitals with BMI 03/23/2020 05/02/2018 05/02/2018  Height 5' 5"  - -  Weight 147 lbs - -  BMI 86.38 - -  Systolic 177 116 579  Diastolic 78 80 85  Pulse 69 77 76     Physical Exam Constitutional:      Appearance: She is normal weight.  Cardiovascular:     Rate and Rhythm: Normal rate and regular rhythm.     Pulses: Intact distal pulses.     Heart sounds: Normal heart sounds. No murmur heard. No gallop.      Comments: No leg edema, no JVD. Pulmonary:     Effort: Pulmonary effort is normal.     Breath sounds: Normal breath sounds.  Abdominal:     General: Bowel sounds are normal.     Palpations: Abdomen is soft.  Skin:    General: Skin is warm and dry.  Neurological:     Mental Status: She is alert.    Laboratory examination:   External labs:    Labs 03/31/2019:  TSH and free T4 normal.  Total cholesterol 201,  triglycerides 171, HDL 58, LDL 113.  Labs 11/21/2018:  Hb 13.4/HCT 38.7, platelets 359, normal indicis.  Serum glucose 85 mg, BUN 12, creatinine 0.81, EGFR 71 mL, CMP otherwise normal.  Total cholesterol 225, triglycerides 121, HDL 74, LDL 130.  TSH minimally elevated at 4.780, FT4 normal at 1. 1 7.  Vitamin D 51.1.  Medications and allergies   Allergies  Allergen Reactions  . Prednisone Itching, Nausea Only and Other (See Comments)    Severe headache, made her feel like she had the flu.  . Pregabalin Rash  . Valium [Diazepam]     Altered mental status     Outpatient Medications Prior to Visit  Medication Sig Dispense Refill  . co-enzyme Q-10 30 MG capsule Take 30 mg by mouth daily.    . Cyanocobalamin (B-12) 5000 MCG SUBL Place 1 tablet under the tongue daily.    Marland Kitchen ezetimibe (ZETIA) 10 MG tablet Take 10 mg by mouth daily.    Marland Kitchen gabapentin (NEURONTIN) 300 MG capsule Take 300 mg by mouth at bedtime as needed (pain).     . hydrochlorothiazide (HYDRODIURIL) 12.5 MG tablet Take 6.25 mg by mouth daily.    . Vitamin A 15000 UNITS TABS Take 1 tablet by mouth daily.    Marland Kitchen losartan (COZAAR) 25 MG tablet Take 25 mg by mouth daily.    Marland Kitchen LYSINE PO Take 300 mg by mouth daily.     Facility-Administered Medications Prior to Visit  Medication Dose Route Frequency Provider Last Rate Last Admin  . 0.9 %  sodium chloride infusion  500 mL Intravenous Continuous Irene Shipper, MD        Radiology:   No results found.  Cardiac Studies:   None EKG:     EKG 03/23/2020: Normal sinus rhythm with rate of 72 bpm, left atrial enlargement, normal axis.  No evidence of ischemia, otherwise normal EKG.      Reviewed home EKG monitoring on husband's phone: Episode at 8:15 PM recorded as A. fib reveals PACs and one ventricular couplet. No atrial fibrillation. About 1 week ago patient has had about 6-8 beat run of atrial tachycardia.  Assessment     ICD-10-CM   1. Angina pectoris (HCC)  I20.9  PCV ECHOCARDIOGRAM COMPLETE    PCV MYOCARDIAL PERFUSION WO LEXISCAN    aspirin EC 81 MG tablet  2. Atrial tachycardia (HCC)  I47.1   3. Palpitation  R00.2 EKG 12-Lead    verapamil (CALAN-SR) 120 MG CR tablet  4. Pure hypercholesterolemia  E78.00   5. Benign hypertension  I10  Medications Discontinued During This Encounter  Medication Reason  . LYSINE PO Patient Preference  . losartan (COZAAR) 25 MG tablet Discontinued by provider    Meds ordered this encounter  Medications  . verapamil (CALAN-SR) 120 MG CR tablet    Sig: Take 1 tablet (120 mg total) by mouth at bedtime.    Dispense:  90 tablet    Refill:  0  . aspirin EC 81 MG tablet    Sig: Take 1 tablet (81 mg total) by mouth daily. Swallow whole.    Dispense:  30 tablet    Refill:  11   Orders Placed This Encounter  Procedures  . PCV MYOCARDIAL PERFUSION WO LEXISCAN    Standing Status:   Future    Standing Expiration Date:   06/21/2020  . EKG 12-Lead  . PCV ECHOCARDIOGRAM COMPLETE    Standing Status:   Future    Standing Expiration Date:   06/21/2020    Recommendations:   KOBIE MATKINS is a 80 y.o. Caucasian female with hypertension, hyperlipidemia, legally blind due to retinitis pigmentosa accompanied by her husband, last evening started having chest tightness and palpitations with radiation to her jaw. Patient's husband has home EKG monitoring which revealed atrial fibrillation. He then presented to the emergency room but as the ED was extremely busy and due to COVID 19, he was able to see his PCP and an EKG was performed which revealed normal sinus rhythm. There were then recommended to follow-up with cardiology, they are seen here today for follow-up.  Her symptoms of chest pain with radiation to jaw or very suggestive of angina pectoris. In view of her age, hypertension and hyperlipidemia as risk factors, I have recommended exercise nuclear stress test and echocardiogram. I will discontinue losartan for now and  switch her to verapamil SR 120 mg daily. She will also add aspirin 81 mg daily for now. She would benefit from being on a statin, we will address this on her next office visit, presently on Zetia. I reviewed her external labs. I will see her back in 3 to 4 weeks for follow-up or sooner if she continues to have recurrent chest pain.   Patient instructed not to do heavy lifting, heavy exertional activity, swimming until evaluation is complete.  Patient instructed to call if symptoms worse or to go to the ED for further evaluation.     Adrian Prows, MD, [facility] 03/23/2020, 2:07 PM Office: [phone]

## 2020-03-29 ENCOUNTER — Other Ambulatory Visit: Payer: BC Managed Care – PPO

## 2020-03-30 ENCOUNTER — Other Ambulatory Visit: Payer: Self-pay | Admitting: Cardiology

## 2020-03-30 ENCOUNTER — Telehealth: Payer: Self-pay | Admitting: Cardiology

## 2020-03-30 DIAGNOSIS — I10 Essential (primary) hypertension: Secondary | ICD-10-CM

## 2020-03-30 DIAGNOSIS — I471 Supraventricular tachycardia: Secondary | ICD-10-CM

## 2020-03-30 MED ORDER — LOSARTAN POTASSIUM 25 MG PO TABS: 25.0000 mg | ORAL_TABLET | Freq: Every day | ORAL | Status: DC

## 2020-03-30 MED ORDER — METOPROLOL SUCCINATE ER 25 MG PO TB24: 25.0000 mg | ORAL_TABLET | Freq: Every day | ORAL | 2 refills | Status: DC

## 2020-03-30 NOTE — Telephone Encounter (Signed)
Called and stated that since being off of losartan and starting verapamil, has had headache and also blood pressure has been elevated.  Will restart losartan, discontinue verapamil and start metoprolol 25 mg daily.    ICD-10-CM   1. Atrial tachycardia (HCC)  I47.1 metoprolol succinate (TOPROL-XL) 25 MG 24 hr tablet  2. Benign hypertension  I10 metoprolol succinate (TOPROL-XL) 25 MG 24 hr tablet    losartan (COZAAR) 25 MG tablet   Meds ordered this encounter  Medications  . metoprolol succinate (TOPROL-XL) 25 MG 24 hr tablet    Sig: Take 1 tablet (25 mg total) by mouth daily. Take with or immediately following a meal.    Dispense:  30 tablet    Refill:  2    Discontinue Verapamil- headache  . losartan (COZAAR) 25 MG tablet    Sig: Take 1 tablet (25 mg total) by mouth daily.    Medications Discontinued During This Encounter  Medication Reason  . verapamil (CALAN-SR) 120 MG CR tablet Side effect (s)  . 0.9 %  sodium chloride infusion Error  . losartan (COZAAR) 25 MG tablet      Adrian Prows, MD, [facility] 03/30/2020, 8:53 PM Office: [phone] Pager: [phone]

## 2020-04-01 ENCOUNTER — Ambulatory Visit: Payer: BC Managed Care – PPO

## 2020-04-01 ENCOUNTER — Other Ambulatory Visit: Payer: Self-pay

## 2020-04-01 DIAGNOSIS — I209 Angina pectoris, unspecified: Secondary | ICD-10-CM | POA: Diagnosis not present

## 2020-04-02 ENCOUNTER — Ambulatory Visit: Payer: BC Managed Care – PPO

## 2020-04-02 DIAGNOSIS — I209 Angina pectoris, unspecified: Secondary | ICD-10-CM

## 2020-04-05 NOTE — Progress Notes (Signed)
Spoke to patient she is aware of echo and stress results

## 2020-04-05 NOTE — Progress Notes (Signed)
Spoke to patient she is aware of echo and stress test results

## 2020-05-02 ENCOUNTER — Other Ambulatory Visit: Payer: Self-pay | Admitting: Cardiology

## 2020-05-02 DIAGNOSIS — I471 Supraventricular tachycardia: Secondary | ICD-10-CM

## 2020-05-02 DIAGNOSIS — I10 Essential (primary) hypertension: Secondary | ICD-10-CM

## 2020-07-19 ENCOUNTER — Other Ambulatory Visit: Payer: Self-pay | Admitting: Obstetrics and Gynecology

## 2020-07-19 DIAGNOSIS — I10 Essential (primary) hypertension: Secondary | ICD-10-CM

## 2020-07-19 DIAGNOSIS — R319 Hematuria, unspecified: Secondary | ICD-10-CM

## 2020-07-20 ENCOUNTER — Other Ambulatory Visit: Payer: Self-pay | Admitting: Obstetrics and Gynecology

## 2020-07-20 DIAGNOSIS — N029 Recurrent and persistent hematuria with unspecified morphologic changes: Secondary | ICD-10-CM

## 2021-02-22 ENCOUNTER — Other Ambulatory Visit: Payer: Self-pay | Admitting: *Deleted

## 2021-02-22 ENCOUNTER — Other Ambulatory Visit: Payer: Self-pay

## 2021-02-22 ENCOUNTER — Ambulatory Visit
Admission: RE | Admit: 2021-02-22 | Discharge: 2021-02-22 | Disposition: A | Discharge status: Home / Self Care | Payer: BC Managed Care – PPO | Source: Ambulatory Visit | Attending: *Deleted | Admitting: *Deleted

## 2021-02-22 DIAGNOSIS — R1030 Lower abdominal pain, unspecified: Secondary | ICD-10-CM

## 2021-02-22 MED ORDER — IOPAMIDOL (ISOVUE-300) INJECTION 61%
100.0000 mL | Freq: Once | INTRAVENOUS | Status: AC | PRN
  Administered 2021-02-22: 100 mL via INTRAVENOUS

## 2021-09-20 ENCOUNTER — Ambulatory Visit: Payer: BC Managed Care – PPO | Admitting: Cardiology

## 2021-09-20 ENCOUNTER — Encounter: Payer: Self-pay | Admitting: Cardiology

## 2021-09-20 VITALS — BP 139/80 | HR 79 | Temp 98.0°F | Resp 17 | Ht 65.0 in | Wt 140.2 lb

## 2021-09-20 DIAGNOSIS — R002 Palpitations: Secondary | ICD-10-CM

## 2021-09-20 DIAGNOSIS — I10 Essential (primary) hypertension: Secondary | ICD-10-CM

## 2021-09-20 DIAGNOSIS — I493 Ventricular premature depolarization: Secondary | ICD-10-CM

## 2021-09-20 MED ORDER — ATENOLOL 25 MG PO TABS: 25.0000 mg | ORAL_TABLET | Freq: Every evening | ORAL | 2 refills | Status: AC

## 2021-09-20 NOTE — Progress Notes (Signed)
Primary Physician/Referring:  Debbora Lacrosse, FNP  Patient ID: Crystal Stokes, female    DOB: [birth year], 81 y.o.   MRN: [id number]  Chief Complaint  Patient presents with   Follow-up   Chest Pain   HPI:    Crystal Stokes  is a 81 y.o. Caucasian female with hypertension, hyperlipidemia, chronic palpitations, legally blind due to retinitis pigmentosa I had seen about 1.5 years ago, her husband called our office and made a request for patient to be seen due to recurrence of palpitations about 3 to 4 days ago.  Symptoms last a few seconds, in the form of skipping beats especially in the evening when she is resting.  No other associated symptoms.  I had last seen her 1.5 years ago for chest pain and at that time in January 2022, she had normal echocardiogram and a nuclear stress test.  Past Medical History:  Diagnosis Date   Cataract    LEFT EYE REMOVED IN JAN 2012   Diverticulitis    Family history of colon cancer    Hemorrhoids    History of shingles     8 times per pt!   Hyperlipidemia    Hypertension    Legal blindness    since 81 years old   Neuromuscular disorder ([place])    per pt, not aware of this dx   Retinitis pigmentosa of both eyes    caused the blindness   Past Surgical History:  Procedure Laterality Date   ANTERIOR CERVICAL DECOMP/DISCECTOMY FUSION  10/12/2011   Procedure: ANTERIOR CERVICAL DECOMPRESSION/DISCECTOMY FUSION 2 LEVELS;  Surgeon: Faythe Ghee, MD;  Location: [facility];  Service: Neurosurgery;  Laterality: N/A;  Cervical Three-Four Cervical Five-Six Anterior Cervical Decompression/Diskectomy,Fusion   CATARACTS  JANUARY   LEFT EYE   COLONOSCOPY     dental implants     EYE SURGERY     Cataract left   HEMORRHOID SURGERY     Retina implant     SPINE SURGERY     Family History  Problem Relation Age of Onset   Colon cancer Mother    Heart failure Brother    Heart attack Sister    Retinitis pigmentosa Paternal Grandmother     Esophageal cancer Neg Hx    Rectal cancer Neg Hx    Stomach cancer Neg Hx     Social History   Tobacco Use   Smoking status: Never   Smokeless tobacco: Never  Substance Use Topics   Alcohol use: Yes    Alcohol/week: 2.0 standard drinks of alcohol    Types: 2 Glasses of wine per week    Comment: occasionally   Marital Status: Married  ROS  Review of Systems  Eyes:  Positive for blurred vision (chronic).  Cardiovascular:  Positive for palpitations. Negative for chest pain, dyspnea on exertion and leg swelling.   Objective  Blood pressure 139/80, pulse 79, temperature 98 F (36.7 C), temperature source Temporal, resp. rate 17, height 5' 5"  (1.651 m), weight 140 lb 3.2 oz (63.6 kg), SpO2 96 %.     09/20/2021    2:01 PM 03/23/2020   11:34 AM 05/02/2018    5:08 AM  Vitals with BMI  Height 5' 5"  5' 5"    Weight 140 lbs 3 oz 147 lbs   BMI 40.98 11.91   Systolic 478 295 621  Diastolic 80 78 80  Pulse 79 69 77     Physical Exam Neck:     Vascular: No JVD.  Cardiovascular:     Rate and Rhythm: Normal rate and regular rhythm.     Pulses: Intact distal pulses.     Heart sounds: Normal heart sounds. No murmur heard.    No gallop.  Pulmonary:     Effort: Pulmonary effort is normal.     Breath sounds: Normal breath sounds.  Abdominal:     General: Bowel sounds are normal.     Palpations: Abdomen is soft.  Musculoskeletal:     Right lower leg: No edema.     Left lower leg: No edema.    Laboratory examination:   External labs:   Labs 09/19/2021:  Serum glucose 100 mg, BUN 9, creatinine 1.0, EGFR 57 mL, potassium 4.0, LFTs normal.  Magnesium 1.8.  Hb 13.2/HCT 39.7, platelets 353.  Mild macrocytosis present.  TSH normal at 3.490.  Serum creatinine total 92, normal.  Labs 03/31/2019:  TSH and free T4 normal.  Total cholesterol 201, triglycerides 171, HDL 58, LDL 113.  Labs 11/21/2018:  Vitamin D 51.1.  Medications and allergies   Allergies  Allergen Reactions    Prednisone Itching, Nausea Only and Other (See Comments)    Severe headache, made her feel like she had the flu.   Pregabalin Rash   Valium [Diazepam]     Altered mental status     Current Outpatient Medications:    acidophilus (RISAQUAD) CAPS capsule, Take 1 capsule by mouth in the morning and at bedtime., Disp: , Rfl:    atenolol (TENORMIN) 25 MG tablet, Take 1 tablet (25 mg total) by mouth every evening. Take extra tablet for palpitations daily, Disp: 40 tablet, Rfl: 2   co-enzyme Q-10 30 MG capsule, Take 30 mg by mouth daily., Disp: , Rfl:    Cyanocobalamin (B-12) 5000 MCG SUBL, Place 1 tablet under the tongue daily., Disp: , Rfl:    ezetimibe (ZETIA) 10 MG tablet, Take 10 mg by mouth daily., Disp: , Rfl:    gabapentin (NEURONTIN) 300 MG capsule, Take 300 mg by mouth at bedtime as needed (pain). , Disp: , Rfl:    losartan (COZAAR) 25 MG tablet, Take 1 tablet (25 mg total) by mouth daily. (Patient taking differently: Take 37.5 mg by mouth in the morning and at bedtime.), Disp: , Rfl:    Vitamin A 15000 UNITS TABS, Take 1 tablet by mouth daily., Disp: , Rfl:     Radiology:   No results found.  Cardiac Studies:   PCV ECHOCARDIOGRAM COMPLETE 04/01/2020  Narrative Echocardiogram 04/01/2020: Study Quality: Technically difficult study. Normal LV systolic function with visual EF 60-65%. Left ventricle cavity is normal in size. Normal global wall motion. Normal diastolic filling pattern, normal LAP. Mild tricuspid regurgitation. No other significant valvular abnormalities. No prior study for comparison.    PCV MYOCARDIAL PERFUSION WO LEXISCAN 04/02/2020  Narrative Lexiscan Sestamibi Stress Test  04/02/2020: Nondiagnostic ECG stress. Myocardial perfusion is normal. Overall LV systolic function is normal without regional wall motion abnormalities. Stress LV EF: 76%. No previous exam available for comparison. Low risk.   EKG:   EKG 09/20/2021: Normal sinus rhythm at rate of 66  bpm, normal axis, single PAC.   EKG 03/23/2020: Normal sinus rhythm with rate of 72 bpm, left atrial enlargement, normal axis.  No evidence of ischemia, otherwise normal EKG.      Reviewed home EKG monitoring on husband's phone: Episode at 8:15 PM recorded as A. fib reveals PACs and one ventricular couplet. No atrial fibrillation. About 1 week ago patient has had  about 6-8 beat run of atrial tachycardia.  Assessment     ICD-10-CM   1. PVC (premature ventricular contraction)  I49.3     2. Palpitation  R00.2 EKG 12-Lead    atenolol (TENORMIN) 25 MG tablet    3. Primary hypertension  I10        Medications Discontinued During This Encounter  Medication Reason   metoprolol succinate (TOPROL-XL) 25 MG 24 hr tablet Side effect (s)   amLODipine (NORVASC) 2.5 MG tablet Side effect (s)   hydrochlorothiazide (HYDRODIURIL) 12.5 MG tablet Side effect (s)   aspirin EC 81 MG tablet Discontinued by provider     Meds ordered this encounter  Medications   atenolol (TENORMIN) 25 MG tablet    Sig: Take 1 tablet (25 mg total) by mouth every evening. Take extra tablet for palpitations daily    Dispense:  40 tablet    Refill:  2   Orders Placed This Encounter  Procedures   EKG 12-Lead    Recommendations:   Crystal Stokes is a 81 y.o. Caucasian female with hypertension, hyperlipidemia, chronic palpitations, legally blind due to retinitis pigmentosa I had seen about 1.5 years ago, her husband called our office and made a request for patient to be seen due to recurrence of palpitations about 3 to 4 days ago.  She also states since yesterday she has not had any further palpitations.  Symptoms of skipped beats and pauses lasting a few seconds appear to suggest PVCs.  Symptoms do not suggest SVT or atrial fibrillation.  Patient also reports that her blood pressure has been very labile with increased blood pressure and normal blood pressure, in view of this, advised her that we could try atenolol 25 mg  in the evening as symptoms of palpitations are occurring in the evening.  She could also take an extra dose if necessary.  I again reviewed her previously performed echocardiogram and nuclear stress test from January 2022 and reassured her.  I also reviewed her external labs.  I do not have a recent lipids but she is presently on Zetia 10 mg daily due to mildly elevated LDL in 2020.  Otherwise stable from cardiac standpoint, I will see her back on a as needed basis.    Adrian Prows, MD, [facility] 09/20/2021, 7:12 PM Office: [phone]

## 2022-01-11 DIAGNOSIS — M25512 Pain in left shoulder: Secondary | ICD-10-CM | POA: Diagnosis not present

## 2022-01-16 DIAGNOSIS — Z85828 Personal history of other malignant neoplasm of skin: Secondary | ICD-10-CM | POA: Diagnosis not present

## 2022-01-16 DIAGNOSIS — D2271 Melanocytic nevi of right lower limb, including hip: Secondary | ICD-10-CM | POA: Diagnosis not present

## 2022-01-16 DIAGNOSIS — D2272 Melanocytic nevi of left lower limb, including hip: Secondary | ICD-10-CM | POA: Diagnosis not present

## 2022-01-16 DIAGNOSIS — L821 Other seborrheic keratosis: Secondary | ICD-10-CM | POA: Diagnosis not present

## 2022-04-06 DIAGNOSIS — Z0389 Encounter for observation for other suspected diseases and conditions ruled out: Secondary | ICD-10-CM | POA: Diagnosis not present

## 2022-04-12 DIAGNOSIS — M47812 Spondylosis without myelopathy or radiculopathy, cervical region: Secondary | ICD-10-CM | POA: Diagnosis not present

## 2022-04-27 ENCOUNTER — Other Ambulatory Visit: Payer: BC Managed Care – PPO

## 2022-04-27 ENCOUNTER — Encounter: Payer: Self-pay | Admitting: Cardiology

## 2022-04-27 ENCOUNTER — Ambulatory Visit: Payer: BC Managed Care – PPO | Admitting: Cardiology

## 2022-04-27 VITALS — BP 105/66 | HR 71 | Resp 16 | Ht 65.0 in | Wt 137.0 lb

## 2022-04-27 DIAGNOSIS — I10 Essential (primary) hypertension: Secondary | ICD-10-CM

## 2022-04-27 DIAGNOSIS — R002 Palpitations: Secondary | ICD-10-CM | POA: Diagnosis not present

## 2022-04-27 DIAGNOSIS — R072 Precordial pain: Secondary | ICD-10-CM | POA: Diagnosis not present

## 2022-04-27 MED ORDER — LOSARTAN POTASSIUM 25 MG PO TABS: 25.0000 mg | ORAL_TABLET | ORAL | Status: DC

## 2022-04-27 NOTE — Progress Notes (Signed)
Primary Physician/Referring:  Debbora Lacrosse, FNP  Patient ID: Crystal Stokes, female    DOB: Feb 19, 1941, 82 y.o.   MRN: [id number]  Chief Complaint  Patient presents with   PVC   Irregular Heart Beat   HPI:    Crystal Stokes  is a 82 y.o. Caucasian female with hypertension, hyperlipidemia, chronic palpitations, legally blind due to retinitis pigmentosa, called our office and made a request for patient to be seen due to recurrence of palpitations.  Symptoms last a few seconds, in the form of skipping beats especially in the evening when she is resting.  No other associated symptoms.     Past Medical History:  Diagnosis Date   Cataract    LEFT EYE REMOVED IN JAN 2012   Diverticulitis    Family history of colon cancer    Hemorrhoids    History of shingles     8 times per pt!   Hyperlipidemia    Hypertension    Legal blindness    since 82 years old   Neuromuscular disorder ([place])    per pt, not aware of this dx   Retinitis pigmentosa of both eyes    caused the blindness   Past Surgical History:  Procedure Laterality Date   ANTERIOR CERVICAL DECOMP/DISCECTOMY FUSION  10/12/2011   Procedure: ANTERIOR CERVICAL DECOMPRESSION/DISCECTOMY FUSION 2 LEVELS;  Surgeon: Faythe Ghee, MD;  Location: [facility];  Service: Neurosurgery;  Laterality: N/A;  Cervical Three-Four Cervical Five-Six Anterior Cervical Decompression/Diskectomy,Fusion   CATARACTS  JANUARY   LEFT EYE   COLONOSCOPY     dental implants     EYE SURGERY     Cataract left   HEMORRHOID SURGERY     Retina implant     SPINE SURGERY     Family History  Problem Relation Age of Onset   Colon cancer Mother    Heart failure Brother    Heart attack Sister    Retinitis pigmentosa Paternal Grandmother    Esophageal cancer Neg Hx    Rectal cancer Neg Hx    Stomach cancer Neg Hx     Social History   Tobacco Use   Smoking status: Never   Smokeless tobacco: Never  Substance Use Topics   Alcohol use:  Yes    Alcohol/week: 2.0 standard drinks of alcohol    Types: 2 Glasses of wine per week    Comment: occasionally   Marital Status: Married  ROS  Review of Systems  Eyes:  Positive for blurred vision (chronic).  Cardiovascular:  Positive for palpitations. Negative for chest pain, dyspnea on exertion and leg swelling.   Objective  Blood pressure 105/66, pulse 71, resp. rate 16, height 5' 5"$  (1.651 m), weight 137 lb (62.1 kg), SpO2 98 %.     04/27/2022   10:17 AM 09/20/2021    2:01 PM 03/23/2020   11:34 AM  Vitals with BMI  Height 5' 5"$  5' 5"$  5' 5"$   Weight 137 lbs 140 lbs 3 oz 147 lbs  BMI 22.8 0000000 0000000  Systolic 123456 [id number]  Diastolic 66 80 78  Pulse 71 79 69     Physical Exam Neck:     Vascular: No JVD.  Cardiovascular:     Rate and Rhythm: Normal rate and regular rhythm.     Pulses: Intact distal pulses.     Heart sounds: Normal heart sounds. No murmur heard.    No gallop.  Pulmonary:     Effort: Pulmonary effort  is normal.     Breath sounds: Normal breath sounds.  Abdominal:     General: Bowel sounds are normal.     Palpations: Abdomen is soft.  Musculoskeletal:     Right lower leg: No edema.     Left lower leg: No edema.    Laboratory examination:   External labs:   Labs 09/19/2021:  Serum glucose 100 mg, BUN 9, creatinine 1.0, EGFR 57 mL, potassium 4.0, LFTs normal.  Magnesium 1.8.  Hb 13.2/HCT 39.7, platelets 353.  Mild macrocytosis present.  TSH normal at 3.490.  Serum creatinine total 92, normal.  Labs 03/31/2019:  TSH and free T4 normal.  Total cholesterol 201, triglycerides 171, HDL 58, LDL 113.  Labs 11/21/2018:  Vitamin D 51.1.  Medications and allergies   Allergies  Allergen Reactions   Prednisone Itching, Nausea Only and Other (See Comments)    Severe headache, made her feel like she had the flu.   Pregabalin Rash   Valium [Diazepam]     Altered mental status     Current Outpatient Medications:    atenolol (TENORMIN) 25 MG  tablet, Take 1 tablet (25 mg total) by mouth every evening. Take extra tablet for palpitations daily, Disp: 40 tablet, Rfl: 2   co-enzyme Q-10 30 MG capsule, Take 30 mg by mouth daily., Disp: , Rfl:    Cyanocobalamin (B-12) 5000 MCG SUBL, Place 1 tablet under the tongue daily., Disp: , Rfl:    ezetimibe (ZETIA) 10 MG tablet, Take 10 mg by mouth daily., Disp: , Rfl:    gabapentin (NEURONTIN) 300 MG capsule, Take 300 mg by mouth at bedtime as needed (pain). , Disp: , Rfl:    Vitamin A 15000 UNITS TABS, Take 1 tablet by mouth daily., Disp: , Rfl:    losartan (COZAAR) 25 MG tablet, Take 1 tablet (25 mg total) by mouth every morning., Disp: , Rfl:     Radiology:   No results found.  Cardiac Studies:   [facility] ECHOCARDIOGRAM COMPLETE 04/01/2020  Narrative Echocardiogram 04/01/2020: Study Quality: Technically difficult study. Normal LV systolic function with visual EF 60-65%. Left ventricle cavity is normal in size. Normal global wall motion. Normal diastolic filling pattern, normal LAP. Mild tricuspid regurgitation. No other significant valvular abnormalities. No prior study for comparison.    [facility] MYOCARDIAL PERFUSION WO LEXISCAN 04/02/2020  Narrative Lexiscan Sestamibi Stress Test  04/02/2020: Nondiagnostic ECG stress. Myocardial perfusion is normal. Overall LV systolic function is normal without regional wall motion abnormalities. Stress LV EF: 76%. No previous exam available for comparison. Low risk.   EKG:   EKG 04/27/2022: Normal sinus rhythm at rate of 68 bpm, normal axis, no evidence of ischemia, normal EKG. Compared to 09/20/2021, no significant change.   Reviewed home EKG monitoring on husband's phone: Episode at 8:15 PM recorded as A. fib reveals PACs and one ventricular couplet. No atrial fibrillation. About 1 week ago patient has had about 6-8 beat run of atrial tachycardia.  Assessment     ICD-10-CM   1. Primary hypertension  I10 losartan (COZAAR) 25 MG tablet    2.  Palpitation  R00.2 EKG 12-Lead    LONG TERM MONITOR (3-14 DAYS)       Medications Discontinued During This Encounter  Medication Reason   acidophilus (RISAQUAD) CAPS capsule Patient Preference   losartan (COZAAR) 25 MG tablet Reorder     Meds ordered this encounter  Medications   losartan (COZAAR) 25 MG tablet    Sig: Take 1 tablet (25 mg  total) by mouth every morning.   Orders Placed This Encounter  Procedures   LONG TERM MONITOR (3-14 DAYS)    Standing Status:   Future    Standing Expiration Date:   04/28/2023    Order Specific Question:   Where should this test be performed?    Answer:   [facility]    Order Specific Question:   Does the patient have an implanted cardiac device?    Answer:   No    Order Specific Question:   Prescribed days of wear    Answer:   54    Order Specific Question:   Type of enrollment    Answer:   Clinic Enrollment    Order Specific Question:   Release to patient    Answer:   Immediate   EKG 12-Lead    Recommendations:   CHALIA MORUA is a 82 y.o. Caucasian female with hypertension, hyperlipidemia, chronic palpitations, legally blind due to retinitis pigmentosa, called our office and made a request for patient to be seen due to recurrence of palpitations.  1. Primary hypertension I reviewed her blood pressure, her blood pressure is very soft, advised her that we will reduce the dose of the losartan from twice daily dosing to once a day in the morning and she will continue to use low-dose atenolol in the evening for palpitations.  2. Palpitation Palpitation suggest PACs and PVCs.  Do not suspect atrial fibrillation or complex arrhythmias.  She has had a essentially normal echocardiogram and negative nuclear stress test for ischemia in January 2022.  Will perform Zio patch for 2 weeks.  Unless this is abnormal, I will see her back on annual basis.  Patient and her husband both prefer for me to continue to see her on a frequent basis.   Can certainly call me back to be seen sooner if needed.  Husband present and all questions answered.   Adrian Prows, MD, [facility] 04/27/2022, 11:01 AM Office: [phone]

## 2022-05-01 ENCOUNTER — Encounter: Payer: Self-pay | Admitting: Cardiology

## 2022-05-01 NOTE — Progress Notes (Signed)
Labs 04/27/2022:  Serum glucose 81 mg, BUN 14, creatinine 0.96, EGFR 60 mL, potassium 4.2, LFTs normal.  Hb 13.1/HCT 38.4, platelets 325.  Mild macrocytosis present.  Total cholesterol 230, triglycerides 120, HDL 64, LDL 145.  TSH normal at 3.810.  Vitamin D 50.3.  Magnesium 1.9.

## 2022-05-16 DIAGNOSIS — H00022 Hordeolum internum right lower eyelid: Secondary | ICD-10-CM | POA: Diagnosis not present

## 2022-05-16 DIAGNOSIS — R002 Palpitations: Secondary | ICD-10-CM | POA: Diagnosis not present

## 2022-05-18 DIAGNOSIS — R002 Palpitations: Secondary | ICD-10-CM | POA: Diagnosis not present

## 2022-05-24 DIAGNOSIS — M1711 Unilateral primary osteoarthritis, right knee: Secondary | ICD-10-CM | POA: Diagnosis not present

## 2022-05-24 DIAGNOSIS — M25561 Pain in right knee: Secondary | ICD-10-CM | POA: Diagnosis not present

## 2022-05-24 DIAGNOSIS — M7062 Trochanteric bursitis, left hip: Secondary | ICD-10-CM | POA: Diagnosis not present

## 2022-06-12 DIAGNOSIS — H40013 Open angle with borderline findings, low risk, bilateral: Secondary | ICD-10-CM | POA: Diagnosis not present

## 2022-06-15 DIAGNOSIS — M7062 Trochanteric bursitis, left hip: Secondary | ICD-10-CM | POA: Diagnosis not present

## 2022-08-10 DIAGNOSIS — Z85828 Personal history of other malignant neoplasm of skin: Secondary | ICD-10-CM | POA: Diagnosis not present

## 2022-08-10 DIAGNOSIS — L820 Inflamed seborrheic keratosis: Secondary | ICD-10-CM | POA: Diagnosis not present

## 2022-08-31 DIAGNOSIS — R519 Headache, unspecified: Secondary | ICD-10-CM | POA: Diagnosis not present

## 2022-11-09 DIAGNOSIS — R911 Solitary pulmonary nodule: Secondary | ICD-10-CM | POA: Diagnosis not present

## 2022-11-15 DIAGNOSIS — Z1231 Encounter for screening mammogram for malignant neoplasm of breast: Secondary | ICD-10-CM | POA: Diagnosis not present

## 2022-11-15 DIAGNOSIS — Z6822 Body mass index (BMI) 22.0-22.9, adult: Secondary | ICD-10-CM | POA: Diagnosis not present

## 2022-11-15 DIAGNOSIS — Z01419 Encounter for gynecological examination (general) (routine) without abnormal findings: Secondary | ICD-10-CM | POA: Diagnosis not present

## 2022-11-29 DIAGNOSIS — B078 Other viral warts: Secondary | ICD-10-CM | POA: Diagnosis not present

## 2022-11-29 DIAGNOSIS — D485 Neoplasm of uncertain behavior of skin: Secondary | ICD-10-CM | POA: Diagnosis not present

## 2022-11-30 DIAGNOSIS — M25561 Pain in right knee: Secondary | ICD-10-CM | POA: Diagnosis not present

## 2023-01-28 ENCOUNTER — Encounter (HOSPITAL_COMMUNITY): Payer: Self-pay

## 2023-01-28 ENCOUNTER — Emergency Department (HOSPITAL_COMMUNITY)
Admission: EM | Admit: 2023-01-28 | Discharge: 2023-01-28 | Disposition: A | Discharge status: Home / Self Care | Payer: BC Managed Care – PPO | Attending: Emergency Medicine | Admitting: Emergency Medicine

## 2023-01-28 ENCOUNTER — Other Ambulatory Visit: Payer: Self-pay

## 2023-01-28 ENCOUNTER — Emergency Department (HOSPITAL_COMMUNITY): Payer: BC Managed Care – PPO

## 2023-01-28 DIAGNOSIS — M62830 Muscle spasm of back: Secondary | ICD-10-CM | POA: Diagnosis not present

## 2023-01-28 DIAGNOSIS — Z79899 Other long term (current) drug therapy: Secondary | ICD-10-CM | POA: Diagnosis not present

## 2023-01-28 DIAGNOSIS — J9811 Atelectasis: Secondary | ICD-10-CM | POA: Diagnosis not present

## 2023-01-28 DIAGNOSIS — R079 Chest pain, unspecified: Secondary | ICD-10-CM | POA: Diagnosis not present

## 2023-01-28 DIAGNOSIS — M549 Dorsalgia, unspecified: Secondary | ICD-10-CM | POA: Diagnosis not present

## 2023-01-28 DIAGNOSIS — I10 Essential (primary) hypertension: Secondary | ICD-10-CM | POA: Insufficient documentation

## 2023-01-28 LAB — COMPREHENSIVE METABOLIC PANEL
ALT: 16 U/L (ref 0–44)
AST: 20 U/L (ref 15–41)
Albumin: 4 g/dL (ref 3.5–5.0)
Alkaline Phosphatase: 103 U/L (ref 38–126)
Anion gap: 10 (ref 5–15)
BUN: 16 mg/dL (ref 8–23)
CO2: 23 mmol/L (ref 22–32)
Calcium: 9.3 mg/dL (ref 8.9–10.3)
Chloride: 99 mmol/L (ref 98–111)
Creatinine, Ser: 0.81 mg/dL (ref 0.44–1.00)
GFR, Estimated: >60 mL/min (ref >60)
Glucose, Bld: 117 mg/dL — ABNORMAL HIGH (ref 70–99)
Potassium: 3.8 mmol/L (ref 3.5–5.1)
Sodium: 132 mmol/L — ABNORMAL LOW (ref 135–145)
Total Bilirubin: 0.8 mg/dL (ref <1.2)
Total Protein: 7.4 g/dL (ref 6.5–8.1)

## 2023-01-28 LAB — CBC WITH DIFFERENTIAL/PLATELET
Abs Immature Granulocytes: 0.03 10*3/uL (ref 0.00–0.07)
Basophils Absolute: 0 10*3/uL (ref 0.0–0.1)
Basophils Relative: 0 %
Eosinophils Absolute: 0.1 10*3/uL (ref 0.0–0.5)
Eosinophils Relative: 1 %
HCT: 36.5 % (ref 36.0–46.0)
Hemoglobin: 12.5 g/dL (ref 12.0–15.0)
Immature Granulocytes: 0 %
Lymphocytes Relative: 19 %
Lymphs Abs: 2 10*3/uL (ref 0.7–4.0)
MCH: 34 pg (ref 26.0–34.0)
MCHC: 34.2 g/dL (ref 30.0–36.0)
MCV: 99.2 fL (ref 80.0–100.0)
Monocytes Absolute: 1 10*3/uL (ref 0.1–1.0)
Monocytes Relative: 10 %
Neutro Abs: 7.4 10*3/uL (ref 1.7–7.7)
Neutrophils Relative %: 70 %
Platelets: 298 10*3/uL (ref 150–400)
RBC: 3.68 MIL/uL — ABNORMAL LOW (ref 3.87–5.11)
RDW: 12.1 % (ref 11.5–15.5)
WBC: 10.6 10*3/uL — ABNORMAL HIGH (ref 4.0–10.5)
nRBC: 0 % (ref 0.0–0.2)

## 2023-01-28 LAB — LIPASE, BLOOD: Lipase: 41 U/L (ref 11–51)

## 2023-01-28 LAB — TROPONIN I (HIGH SENSITIVITY)
Troponin I (High Sensitivity): 5 ng/L (ref <18)
Troponin I (High Sensitivity): 5 ng/L (ref <18)

## 2023-01-28 MED ORDER — HYDROMORPHONE HCL 1 MG/ML IJ SOLN
1.0000 mg | Freq: Once | INTRAMUSCULAR | Status: AC
  Administered 2023-01-28: 1 mg via INTRAVENOUS
  Filled 2023-01-28: qty 1

## 2023-01-28 MED ORDER — CYCLOBENZAPRINE HCL 10 MG PO TABS
5.0000 mg | ORAL_TABLET | Freq: Once | ORAL | Status: AC
  Administered 2023-01-28: 5 mg via ORAL
  Filled 2023-01-28: qty 1

## 2023-01-28 NOTE — ED Triage Notes (Signed)
Mid thoracic back pains manifesting earlier today.   Says she has a very recurrent shingles and it feels identical. No visible rash.   Has taken Gabapentin with no improvement.

## 2023-01-28 NOTE — ED Notes (Signed)
Pt left without paperwork. Pt informed that information would be available on mychart.

## 2023-01-28 NOTE — ED Provider Notes (Signed)
[place] EMERGENCY DEPARTMENT AT [facility] Provider Note   CSN: [id number] Arrival date & time: 01/28/23  3557     History  Chief Complaint  Patient presents with   Back Pain    Crystal Stokes is a 82 y.o. female.  Patient with a history of hypertension, postherpetic neuralgia, blindness presents with midthoracic back "spasms" since about 10 AM.  She denies any injury or lifting.  Spasms are both sides of her thoracic rib cage worse with movement and waxes and wanes in severity.  At home she has tried Lidoderm patches as well as takes her chronic gabapentin.  She did take some as needed acyclovir and oxycodone because she was concerned this could be a flareup of shingles but has not seen a rash.  She has had shingles multiple times in the past and this feels similar to her postherpetic neuralgia though she has never had pain in this location previously.  No radiation of the pain down her legs.  No bowel or bladder incontinence.  No fever or vomiting.  No chest pain or shortness of breath.  No history of IV drug abuse or cancer.  No abdominal pain, pain with urination or blood in the urine.  She has had similar spasms in the past but never this location.  History of back surgery remotely.  The history is provided by the patient and a relative.  Back Pain Associated symptoms: no abdominal pain, no chest pain, no dysuria, no fever, no headaches and no weakness        Home Medications Prior to Admission medications   Medication Sig Start Date End Date Taking? Authorizing Provider  atenolol (TENORMIN) 25 MG tablet Take 1 tablet (25 mg total) by mouth every evening. Take extra tablet for palpitations daily 09/20/21 04/27/22  Yates Decamp, MD  co-enzyme Q-10 30 MG capsule Take 30 mg by mouth daily.    [provider]  Cyanocobalamin (B-12) 5000 MCG SUBL Place 1 tablet under the tongue daily.    [provider]  ezetimibe (ZETIA) 10 MG tablet Take 10 mg by mouth  daily.    [provider]  gabapentin (NEURONTIN) 300 MG capsule Take 300 mg by mouth at bedtime as needed (pain).  08/17/14   [provider]  losartan (COZAAR) 25 MG tablet Take 1 tablet (25 mg total) by mouth every morning. 04/27/22   Yates Decamp, MD  Vitamin A 32202 UNITS TABS Take 1 tablet by mouth daily.    [provider]      Allergies    Prednisone, Pregabalin, and Valium [diazepam]    Review of Systems   Review of Systems  Constitutional:  Negative for activity change, appetite change and fever.  HENT:  Negative for congestion and rhinorrhea.   Respiratory:  Negative for cough, chest tightness and shortness of breath.   Cardiovascular:  Negative for chest pain.  Gastrointestinal:  Negative for abdominal pain, nausea and vomiting.  Genitourinary:  Negative for dysuria and hematuria.  Musculoskeletal:  Positive for back pain.  Skin:  Negative for rash.  Neurological:  Negative for dizziness, weakness and headaches.   all other systems are negative except as noted in the HPI and PMH.    Physical Exam Updated Vital Signs BP (!) 153/81   Pulse 76   Temp 97.6 F (36.4 C)   Resp 16   Ht 5\' 5"  (1.651 m)   Wt 61.7 kg   SpO2 100%   BMI 22.63 kg/m  Physical Exam Vitals and nursing note reviewed.  Constitutional:      General: She is not in acute distress.    Appearance: She is well-developed. She is not ill-appearing.  HENT:     Head: Normocephalic and atraumatic.     Mouth/Throat:     Pharynx: No oropharyngeal exudate.  Eyes:     Conjunctiva/sclera: Conjunctivae normal.     Pupils: Pupils are equal, round, and reactive to light.  Neck:     Comments: No meningismus. Cardiovascular:     Rate and Rhythm: Normal rate and regular rhythm.     Heart sounds: Normal heart sounds. No murmur heard. Pulmonary:     Effort: Pulmonary effort is normal. No respiratory distress.     Breath sounds: Normal breath sounds.  Chest:     Chest wall: No  tenderness.  Abdominal:     Palpations: Abdomen is soft.     Tenderness: There is no abdominal tenderness. There is no guarding or rebound.  Musculoskeletal:        General: Tenderness present. Normal range of motion.     Cervical back: Normal range of motion and neck supple.     Comments: Paraspinal thoracic back tenderness bilaterally, no midline tenderness.  There is slight erythema to her paraspinal area where she had Lidoderm patches in place.  No vesicular rash visualized  Skin:    General: Skin is warm.  Neurological:     Mental Status: She is alert and oriented to person, place, and time.     Cranial Nerves: No cranial nerve deficit.     Motor: No abnormal muscle tone.     Coordination: Coordination normal.     Comments:  5/5 strength throughout. CN 2-12 intact.Equal grip strength.   Psychiatric:        Behavior: Behavior normal.     ED Results / Procedures / Treatments   Labs (all labs ordered are listed, but only abnormal results are displayed) Labs Reviewed  CBC WITH DIFFERENTIAL/PLATELET - Abnormal; Notable for the following components:      Result Value   WBC 10.6 (*)    RBC 3.68 (*)    All other components within normal limits  COMPREHENSIVE METABOLIC PANEL - Abnormal; Notable for the following components:   Sodium 132 (*)    Glucose, Bld 117 (*)    All other components within normal limits  LIPASE, BLOOD  URINALYSIS, ROUTINE W REFLEX MICROSCOPIC  TROPONIN I (HIGH SENSITIVITY)  TROPONIN I (HIGH SENSITIVITY)    EKG EKG Interpretation Date/Time:  Sunday January 28 2023 01:32:41 EST Ventricular Rate:  75 PR Interval:  192 QRS Duration:  104 QT Interval:  411 QTC Calculation: 460 R Axis:   47  Text Interpretation: Sinus rhythm Low voltage, precordial leads No significant change was found Confirmed by Glynn Octave [phone]) on 01/28/2023 1:36:25 AM  Radiology DG Chest 2 View  Result Date: 01/28/2023 CLINICAL DATA:  Chest and back pain, initial  encounter EXAM: CHEST - 2 VIEW COMPARISON:  05/01/2018 FINDINGS: Cardiac shadow is within normal limits. Mild bibasilar atelectatic changes are seen. No focal infiltrate is noted. No sizable effusion is seen. No bony abnormality noted. IMPRESSION: Mild bibasilar atelectasis without acute abnormality Electronically Signed   By: Alcide Clever M.D.   On: 01/28/2023 01:31    Procedures Procedures    Medications Ordered in ED Medications  HYDROmorphone (DILAUDID) injection 1 mg (has no administration in time range)  cyclobenzaprine (FLEXERIL) tablet 5 mg (has no administration in  time range)    ED Course/ Medical Decision Making/ A&P                                 Medical Decision Making Amount and/or Complexity of Data Reviewed Labs: ordered. Decision-making details documented in ED Course. Radiology: ordered and independent interpretation performed. Decision-making details documented in ED Course. ECG/medicine tests: ordered and independent interpretation performed. Decision-making details documented in ED Course.  Risk Prescription drug management.   Atraumatic back spasm x 1 day.  Intact distal strength, sensation, pulses and reflexes.  Low suspicion for cord compression or cauda equina.  Low suspicion for atypical presentation of ACS but will screen with EKG.  EKG shows no acute ischemia.  No acute ST changes.  Chest x-ray is negative.  No evidence of pneumothorax, pneumonia or widened mediastinum. No hypoxia or tachycardia.  Low suspicion for PE. Equal upper extremity grip strengths, pulses and blood pressures.  Normal mediastinum on x-ray with low concern for aortic dissection. CT scan 2 years ago showed no evidence of abdominal aortic aneurysm.  Patient feels improved after pain treated in the ED with Flexeril as well as Dilaudid.  She did not like the way Dilaudid made her feel and states she does not want that medication again in the future.  She did have this medication in  2020 previously however.  Patient feels much improved and is requesting discharge.  Electrolytes are reassuring.  Troponin negative.  Family agreeable to have second troponin drawn but do not want to wait for results.  They state they will have her NP call for a Flexeril prescription in the morning and do not want any prescriptions tonight.  Patient anxious to leave.  Patient eloped from the ED without receiving her discharge instructions.  Second troponin negative.        Final Clinical Impression(s) / ED Diagnoses Final diagnoses:  Back muscle spasm    Rx / DC Orders ED Discharge Orders     None         Mariajose Mow, Jeannett Senior, MD 01/28/23 [phone]

## 2023-01-28 NOTE — ED Notes (Signed)
Pt receiving dilaudid from nurse. Pt c/o of increasing back/flank pain. Pt/family request nurse to stop giving dilaudid. MD notified.

## 2023-02-13 DIAGNOSIS — Z85828 Personal history of other malignant neoplasm of skin: Secondary | ICD-10-CM | POA: Diagnosis not present

## 2023-02-13 DIAGNOSIS — L720 Epidermal cyst: Secondary | ICD-10-CM | POA: Diagnosis not present

## 2023-03-12 ENCOUNTER — Telehealth: Payer: Self-pay | Admitting: Cardiology

## 2023-03-12 NOTE — Telephone Encounter (Signed)
Left voicemail to return call office

## 2023-03-14 NOTE — Progress Notes (Signed)
 Cardiology Office Note:  .   Date:  03/15/2023  ID:  Crystal Stokes, DOB 1941/03/06, MRN [id number] PCP: Tonna Karna Jean, FNP  [facility] Providers Cardiologist:  Gordy Bergamo, MD   History of Present Illness: Crystal   [name] is a 83 y.o. Caucasian female with hypertension, hyperlipidemia, chronic palpitations, legally blind due to retinitis pigmentosa, called our office and made a request for hypertension.   Discussed the use of AI scribe software for clinical note transcription with the patient, who gave verbal consent to proceed.  History of Present Illness   The patient, with a history of hypertension, is accompanied by her husband who is actively involved in her care. The patient's blood pressure has been more consistent recently, following a change in her medication regimen. The patient is currently on Losartan  50mg  twice daily and Hydralazine 10mg  as needed, which she takes when her blood pressure reaches 150. Patient's blood pressure seems to be more under control since starting this regimen. The patient's blood pressure is monitored regularly, and it tends to be higher in the evenings. The patient does not report any symptoms of hypertension, such as headaches, dizziness, or chest pain.  Since being on atenolol  , she has not had any further palpitations.     Labs    Lab Results  Component Value Date   NA 132 (L) 01/28/2023   K 3.8 01/28/2023   CO2 23 01/28/2023   GLUCOSE 117 (H) 01/28/2023   BUN 16 01/28/2023   CREATININE 0.81 01/28/2023   CALCIUM 9.3 01/28/2023   GFRNONAA >60 01/28/2023      Latest Ref Rng & Units 01/28/2023    1:34 AM 03/26/2016    9:40 AM 10/12/2011    6:32 AM  BMP  Glucose 70 - 99 mg/dL 882  897  888   BUN 8 - 23 mg/dL 16  13  12    Creatinine 0.44 - 1.00 mg/dL 9.18  9.14  9.02   Sodium 135 - 145 mmol/L 132  135  140   Potassium 3.5 - 5.1 mmol/L 3.8  3.2  3.6   Chloride 98 - 111 mmol/L 99  101  102   CO2 22 - 32 mmol/L 23  25   27    Calcium 8.9 - 10.3 mg/dL 9.3  9.4  9.7       Latest Ref Rng & Units 01/28/2023    1:34 AM 03/26/2016    9:40 AM 10/12/2011    6:32 AM  CBC  WBC 4.0 - 10.5 K/uL 10.6  6.0  6.8   Hemoglobin 12.0 - 15.0 g/dL 87.4  87.7  88.7   Hematocrit 36.0 - 46.0 % 36.5  35.4  33.2   Platelets 150 - 400 K/uL 298  316  278    Review of Systems  Cardiovascular:  Negative for chest pain, dyspnea on exertion and leg swelling.    Physical Exam:   VS:  BP 124/60   Pulse 63   Ht 5' 5 (1.651 m)   Wt 139 lb (63 kg)   SpO2 94%   BMI 23.13 kg/m    Wt Readings from Last 3 Encounters:  03/15/23 139 lb (63 kg)  01/28/23 136 lb (61.7 kg)  04/27/22 137 lb (62.1 kg)     Physical Exam Neck:     Vascular: No carotid bruit or JVD.  Cardiovascular:     Rate and Rhythm: Normal rate and regular rhythm.     Pulses: Intact distal  pulses.     Heart sounds: Normal heart sounds. No murmur heard.    No gallop.  Pulmonary:     Effort: Pulmonary effort is normal.     Breath sounds: Normal breath sounds.  Abdominal:     General: Bowel sounds are normal.     Palpations: Abdomen is soft.  Musculoskeletal:     Right lower leg: No edema.     Left lower leg: No edema.     Studies Reviewed: .    Echocardiogram 04/01/2020: Study Quality: Technically difficult study. Normal LV systolic function with visual EF 60-65%. Left ventricle cavity is normal in size. Normal global wall motion. Normal diastolic filling pattern, normal LAP. Mild tricuspid regurgitation. No other significant valvular abnormalities. No prior study for comparison.  Lexiscan  Sestamibi Stress Test  04/02/2020: Nondiagnostic ECG stress. Myocardial perfusion is normal. Overall LV systolic function is normal without regional wall motion abnormalities. Stress LV EF: 76%. No previous exam available for comparison. Low risk.  EKG:    PCP EKG 01/28/2023: Normal sinus rhythm at rate of 75 bpm, normal axis, poor R progression, probably normal  variant.  No evidence of ischemia.  EKG 04/27/2022: Normal sinus rhythm at rate of 68 bpm, normal axis, no evidence of ischemia, normal EKG.   Medications and allergies    Allergies  Allergen Reactions   Prednisone  Itching, Nausea Only and Other (See Comments)    Severe headache, made her feel like she had the flu.   Pregabalin Rash   Valium [Diazepam]     Altered mental status     Current Outpatient Medications:    acyclovir (ZOVIRAX) 200 MG capsule, Take 200 mg by mouth as needed., Disp: , Rfl:    atenolol  (TENORMIN ) 25 MG tablet, Take 1 tablet (25 mg total) by mouth every evening. Take extra tablet for palpitations daily, Disp: 40 tablet, Rfl: 2   co-enzyme Q-10 30 MG capsule, Take 30 mg by mouth daily., Disp: , Rfl:    Cyanocobalamin (B-12) 5000 MCG SUBL, Place 1 tablet under the tongue daily., Disp: , Rfl:    cyclobenzaprine  (FLEXERIL ) 5 MG tablet, Take 5 mg by mouth as needed., Disp: , Rfl:    ezetimibe (ZETIA) 10 MG tablet, Take 10 mg by mouth daily., Disp: , Rfl:    gabapentin  (NEURONTIN ) 300 MG capsule, Take 300 mg by mouth at bedtime as needed (pain). , Disp: , Rfl:    hydrALAZINE (APRESOLINE) 10 MG tablet, Take 10 mg by mouth as needed. Pt takes 1 tablet 10 mg when BP is > 150, Disp: , Rfl:    losartan  (COZAAR ) 50 MG tablet, Take 50 mg by mouth 2 (two) times daily., Disp: , Rfl:    Vitamin A 84999 UNITS TABS, Take 1 tablet by mouth daily., Disp: , Rfl:    ASSESSMENT AND PLAN: .      ICD-10-CM   1. Primary hypertension  I10     2. Palpitation  R00.2       1. Primary hypertension  Blood pressure control has improved with recent medication adjustments (Losartan  50mg  BID, Hydralazine 10mg  as needed). Patient's blood pressure has been more consistent with this regimen. -Continue current medication regimen. -Use Hydralazine as needed if BP reaches 150. -If Hydralazine is needed daily, consider switching from Losartan  to Olmesartan for more sustained effect. -Check blood  pressure regularly and report if consistently high.  2. Palpitations Presently on atenolol  25 mg in the evening and presently remains asymptomatic without recurrence.  Hence we  will continue the same.  EKG reviewed, normal sinus rhythm.  EKG from PCPs office a month ago is unchanged from EKG done 6 to 8 months ago in our office. General Health Maintenance -Encourage regular exercise. -Schedule follow-up appointment in 1 year.   Signed,  Gordy Bergamo, MD, [facility] 03/15/2023, 6:03 PM [facility] [address] [place], [place] [address] Phone: [phone]. Fax:  [phone]

## 2023-03-15 ENCOUNTER — Ambulatory Visit: Payer: BC Managed Care – PPO | Attending: Cardiology | Admitting: Cardiology

## 2023-03-15 ENCOUNTER — Encounter: Payer: Self-pay | Admitting: Cardiology

## 2023-03-15 VITALS — BP 124/60 | HR 63 | Ht 65.0 in | Wt 139.0 lb

## 2023-03-15 DIAGNOSIS — R002 Palpitations: Secondary | ICD-10-CM

## 2023-03-15 DIAGNOSIS — I10 Essential (primary) hypertension: Secondary | ICD-10-CM

## 2023-03-15 NOTE — Patient Instructions (Signed)

## 2023-03-15 NOTE — Telephone Encounter (Signed)
 Patient had office visit with Dr Jacinto Halim today

## 2023-06-18 DIAGNOSIS — M7062 Trochanteric bursitis, left hip: Secondary | ICD-10-CM | POA: Diagnosis not present

## 2023-06-18 DIAGNOSIS — M25561 Pain in right knee: Secondary | ICD-10-CM | POA: Diagnosis not present

## 2023-06-18 DIAGNOSIS — M25552 Pain in left hip: Secondary | ICD-10-CM | POA: Diagnosis not present

## 2023-06-22 DIAGNOSIS — M7542 Impingement syndrome of left shoulder: Secondary | ICD-10-CM | POA: Diagnosis not present

## 2023-06-27 DIAGNOSIS — H3552 Pigmentary retinal dystrophy: Secondary | ICD-10-CM | POA: Diagnosis not present

## 2023-06-27 DIAGNOSIS — Z961 Presence of intraocular lens: Secondary | ICD-10-CM | POA: Diagnosis not present

## 2023-07-16 DIAGNOSIS — L820 Inflamed seborrheic keratosis: Secondary | ICD-10-CM | POA: Diagnosis not present

## 2023-07-16 DIAGNOSIS — L570 Actinic keratosis: Secondary | ICD-10-CM | POA: Diagnosis not present

## 2023-09-04 DIAGNOSIS — M25561 Pain in right knee: Secondary | ICD-10-CM | POA: Diagnosis not present

## 2023-09-19 ENCOUNTER — Other Ambulatory Visit: Payer: Self-pay | Admitting: *Deleted

## 2023-09-19 DIAGNOSIS — I739 Peripheral vascular disease, unspecified: Secondary | ICD-10-CM

## 2023-09-19 NOTE — Progress Notes (Signed)
 Message from Dr Ladona-  please order lower extremity arterial duplex. Indication claudication in peripheral artery disease

## 2023-09-20 NOTE — Addendum Note (Signed)
 Addended by: Zolton Dowson L on: 09/20/2023 10:35 AM   Modules accepted: Orders

## 2023-09-21 LAB — LAB REPORT - SCANNED
A1c: 5.7
EGFR: 63

## 2023-10-02 ENCOUNTER — Ambulatory Visit (HOSPITAL_COMMUNITY)
Admission: RE | Admit: 2023-10-02 | Discharge: 2023-10-02 | Disposition: A | Discharge status: Home / Self Care | Source: Ambulatory Visit | Attending: Cardiology | Admitting: Cardiology

## 2023-10-02 ENCOUNTER — Ambulatory Visit: Payer: Self-pay | Admitting: Cardiology

## 2023-10-02 DIAGNOSIS — I739 Peripheral vascular disease, unspecified: Secondary | ICD-10-CM | POA: Diagnosis not present

## 2023-10-02 LAB — VAS US ABI WITH/WO TBI
Left ABI: 1.04
Right ABI: 1.07

## 2023-10-02 NOTE — Progress Notes (Signed)
 She has normal ABI and normal triphasic waveform pattern at the ankles suggesting her circulation is intact and her symptoms may not be related to PAD. They have my cell number and can call if questions

## 2023-10-08 NOTE — Telephone Encounter (Signed)
 Pcp -Karna Begun called back on pt's behalf to get results due to her medical condition. Results given

## 2023-12-03 DIAGNOSIS — L820 Inflamed seborrheic keratosis: Secondary | ICD-10-CM | POA: Diagnosis not present

## 2023-12-28 DIAGNOSIS — M1711 Unilateral primary osteoarthritis, right knee: Secondary | ICD-10-CM | POA: Diagnosis not present

## 2023-12-28 DIAGNOSIS — M7062 Trochanteric bursitis, left hip: Secondary | ICD-10-CM | POA: Diagnosis not present

## 2024-01-31 DIAGNOSIS — Z1231 Encounter for screening mammogram for malignant neoplasm of breast: Secondary | ICD-10-CM | POA: Diagnosis not present

## 2024-02-21 DIAGNOSIS — L821 Other seborrheic keratosis: Secondary | ICD-10-CM | POA: Diagnosis not present

## 2024-02-21 DIAGNOSIS — C44722 Squamous cell carcinoma of skin of right lower limb, including hip: Secondary | ICD-10-CM | POA: Diagnosis not present

## 2024-02-21 DIAGNOSIS — D692 Other nonthrombocytopenic purpura: Secondary | ICD-10-CM | POA: Diagnosis not present

## 2024-02-21 DIAGNOSIS — Z85828 Personal history of other malignant neoplasm of skin: Secondary | ICD-10-CM | POA: Diagnosis not present

## 2024-03-03 DIAGNOSIS — L820 Inflamed seborrheic keratosis: Secondary | ICD-10-CM | POA: Diagnosis not present

## 2024-03-03 DIAGNOSIS — C44722 Squamous cell carcinoma of skin of right lower limb, including hip: Secondary | ICD-10-CM | POA: Diagnosis not present

## 2024-03-03 DIAGNOSIS — Z85828 Personal history of other malignant neoplasm of skin: Secondary | ICD-10-CM | POA: Diagnosis not present

## 2024-04-18 ENCOUNTER — Ambulatory Visit: Admitting: Cardiology

## 2024-04-18 ENCOUNTER — Encounter: Payer: Self-pay | Admitting: Cardiology

## 2024-04-18 VITALS — BP 124/76 | HR 66 | Ht 67.0 in | Wt 137.0 lb

## 2024-04-18 DIAGNOSIS — I10 Essential (primary) hypertension: Secondary | ICD-10-CM

## 2024-04-18 DIAGNOSIS — R0989 Other specified symptoms and signs involving the circulatory and respiratory systems: Secondary | ICD-10-CM

## 2024-04-18 DIAGNOSIS — R011 Cardiac murmur, unspecified: Secondary | ICD-10-CM

## 2024-04-18 DIAGNOSIS — R002 Palpitations: Secondary | ICD-10-CM

## 2024-04-18 NOTE — Patient Instructions (Signed)
 Medication Instructions:  Your physician recommends that you continue on your current medications as directed. Please refer to the Current Medication list given to you today.  *If you need a refill on your cardiac medications before your next appointment, please call your pharmacy*  Testing/Procedures:  ECHOCARDIOGRAM  Your physician has requested that you have an echocardiogram. Echocardiography is a painless test that uses sound waves to create images of your heart. It provides your doctor with information about the size and shape of your heart and how well your hearts chambers and valves are working. This procedure takes approximately one hour. There are no restrictions for this procedure. Please do NOT wear cologne, perfume, aftershave, or lotions (deodorant is allowed). Please arrive 15 minutes prior to your appointment time.  Please note: We ask at that you not bring children with you during ultrasound (echo/ vascular) testing. Due to room size and safety concerns, children are not allowed in the ultrasound rooms during exams. Our front office staff cannot provide observation of children in our lobby area while testing is being conducted. An adult accompanying a patient to their appointment will only be allowed in the ultrasound room at the discretion of the ultrasound technician under special circumstances. We apologize for any inconvenience.   CAROTID ARTERY DUPLEX  Your physician has requested that you have a carotid duplex. This test is an ultrasound of the carotid arteries in your neck. It looks at blood flow through these arteries that supply the brain with blood. Allow one hour for this exam. There are no restrictions or special instructions.    Follow-Up: At [facility], you and your health needs are our priority.  As part of our continuing mission to provide you with exceptional heart care, our providers are all part of one team.  This team includes your primary  Cardiologist (physician) and Advanced Practice Providers or APPs (Physician Assistants and Nurse Practitioners) who all work together to provide you with the care you need, when you need it.  Your next appointment:   1 year(s)  Provider:   Gordy Bergamo, MD

## 2024-04-18 NOTE — Progress Notes (Unsigned)
" °  Cardiology Office Note:  .   Date:  04/18/2024  ID:  Crystal Stokes, DOB May 30, 1940, MRN [id number] PCP: Tonna Karna Jean, FNP  [facility] Providers Cardiologist:  Gordy Bergamo, MD { Click to update primary MD,subspecialty MD or APP then REFRESH:1}  History of Present Illness: Crystal Stokes is a 84 y.o.  Caucasian female with hypertension, hyperlipidemia, chronic palpitations, legally blind due to retinitis pigmentosa     Discussed the use of AI scribe software for clinical note transcription with the patient, who gave verbal consent to proceed.  History of Present Illness   Cardiac Studies relevent.    Echocardiogram 04/01/2020: Study Quality: Technically difficult study. Normal LV systolic function with visual EF 60-65%. Left ventricle cavity is normal in size. Normal global wall motion. Normal diastolic filling pattern, normal LAP.  Lexiscan  Sestamibi Stress Test  04/02/2020: Myocardial perfusion is normal. LVEF 76%  EKG:      Labs   Care everywhere/Faxed External Labs:  Labs EKG 03/27/2024  Serum glucose 81 mg, BUN 16, creatinine 1.07, eGFR 52 mL, potassium 4.2, LFTs normal.  Magnesium 2.9.  Hb 13.7/HCT 38.4, platelets 378.  Total cholesterol 179, triglycerides 66, HDL 72, LDL 94.  A1c 5.7%.  TSH minimally elevated at 5.790, normal free T4.  ROS  ***Review of Systems  Cardiovascular:  Negative for chest pain, dyspnea on exertion and leg swelling.   Physical Exam:   VS:  There were no vitals taken for this visit.   Wt Readings from Last 3 Encounters:  03/15/23 139 lb (63 kg)  01/28/23 136 lb (61.7 kg)  04/27/22 137 lb (62.1 kg)    BP Readings from Last 3 Encounters:  03/15/23 124/60  01/28/23 (!) 153/81  04/27/22 105/66   ***Physical Exam Neck:     Vascular: Carotid bruit (left) present. No JVD.  Cardiovascular:     Rate and Rhythm: Normal rate and regular rhythm.     Pulses: Intact distal pulses.     Heart sounds: S1 normal and  S2 normal. Murmur heard.     Early systolic murmur is present with a grade of 2/6 at the upper right sternal border.     No gallop.  Pulmonary:     Effort: Pulmonary effort is normal.     Breath sounds: Normal breath sounds.  Abdominal:     General: Bowel sounds are normal.     Palpations: Abdomen is soft.  Musculoskeletal:     Right lower leg: No edema.     Left lower leg: No edema.     ASSESSMENT AND PLAN: .      ICD-10-CM   1. Primary hypertension  I10 EKG 12-Lead    2. Palpitation  R00.2      Assessment & Plan   Follow up: ***  Signed,  Gordy Bergamo, MD, [facility] 04/18/2024, 4:10 PM [facility] [address] [place], [place] [address] Phone: [phone]. Fax:  [phone]  "
